# Patient Record
Sex: Female | Born: 1945
Health system: Southern US, Community
[De-identification: ages and names within clinical notes are randomized; demographics above are authoritative.]

## PROBLEM LIST (undated history)

## (undated) DIAGNOSIS — K219 Gastro-esophageal reflux disease without esophagitis: Secondary | ICD-10-CM

## (undated) HISTORY — DX: Gastro-esophageal reflux disease without esophagitis: K21.9

## (undated) HISTORY — PX: CATARACT EXTRACTION: SUR2

## (undated) HISTORY — PX: SHOULDER SURGERY: SHX246

## (undated) HISTORY — PX: TUBAL LIGATION: SHX77

---

## 1998-01-11 ENCOUNTER — Other Ambulatory Visit: Admission: RE | Admit: 1998-01-11 | Discharge: 1998-01-11 | Payer: Self-pay | Admitting: Obstetrics and Gynecology

## 1998-03-29 ENCOUNTER — Ambulatory Visit (HOSPITAL_BASED_OUTPATIENT_CLINIC_OR_DEPARTMENT_OTHER): Admission: RE | Admit: 1998-03-29 | Discharge: 1998-03-29 | Payer: Self-pay | Admitting: Orthopedic Surgery

## 1999-04-30 ENCOUNTER — Other Ambulatory Visit: Admission: RE | Admit: 1999-04-30 | Discharge: 1999-04-30 | Payer: Self-pay | Admitting: Obstetrics and Gynecology

## 2000-04-29 ENCOUNTER — Other Ambulatory Visit: Admission: RE | Admit: 2000-04-29 | Discharge: 2000-04-29 | Payer: Self-pay | Admitting: Obstetrics and Gynecology

## 2001-04-29 ENCOUNTER — Other Ambulatory Visit: Admission: RE | Admit: 2001-04-29 | Discharge: 2001-04-29 | Payer: Self-pay | Admitting: Obstetrics and Gynecology

## 2002-05-02 ENCOUNTER — Other Ambulatory Visit: Admission: RE | Admit: 2002-05-02 | Discharge: 2002-05-02 | Payer: Self-pay | Admitting: Obstetrics and Gynecology

## 2003-05-03 ENCOUNTER — Other Ambulatory Visit: Admission: RE | Admit: 2003-05-03 | Discharge: 2003-05-03 | Payer: Self-pay | Admitting: Gynecology

## 2004-05-06 ENCOUNTER — Other Ambulatory Visit: Admission: RE | Admit: 2004-05-06 | Discharge: 2004-05-06 | Payer: Self-pay | Admitting: Obstetrics and Gynecology

## 2005-05-20 ENCOUNTER — Other Ambulatory Visit: Admission: RE | Admit: 2005-05-20 | Discharge: 2005-05-20 | Payer: Self-pay | Admitting: Obstetrics and Gynecology

## 2006-05-22 ENCOUNTER — Other Ambulatory Visit: Admission: RE | Admit: 2006-05-22 | Discharge: 2006-05-22 | Payer: Self-pay | Admitting: Obstetrics and Gynecology

## 2006-12-02 ENCOUNTER — Other Ambulatory Visit: Admission: RE | Admit: 2006-12-02 | Discharge: 2006-12-02 | Payer: Self-pay | Admitting: Obstetrics and Gynecology

## 2007-05-24 ENCOUNTER — Other Ambulatory Visit: Admission: RE | Admit: 2007-05-24 | Discharge: 2007-05-24 | Payer: Self-pay | Admitting: Obstetrics and Gynecology

## 2008-05-24 ENCOUNTER — Other Ambulatory Visit: Admission: RE | Admit: 2008-05-24 | Discharge: 2008-05-24 | Payer: Self-pay | Admitting: Obstetrics and Gynecology

## 2008-05-24 ENCOUNTER — Encounter: Payer: Self-pay | Admitting: Obstetrics and Gynecology

## 2008-05-24 ENCOUNTER — Ambulatory Visit: Payer: Self-pay | Admitting: Obstetrics and Gynecology

## 2009-05-29 ENCOUNTER — Ambulatory Visit: Payer: Self-pay | Admitting: Obstetrics and Gynecology

## 2009-05-29 ENCOUNTER — Other Ambulatory Visit: Admission: RE | Admit: 2009-05-29 | Discharge: 2009-05-29 | Payer: Self-pay | Admitting: Obstetrics and Gynecology

## 2010-06-03 ENCOUNTER — Encounter: Payer: Self-pay | Admitting: Obstetrics and Gynecology

## 2011-06-09 DIAGNOSIS — Z79899 Other long term (current) drug therapy: Secondary | ICD-10-CM | POA: Diagnosis not present

## 2011-06-09 DIAGNOSIS — E559 Vitamin D deficiency, unspecified: Secondary | ICD-10-CM | POA: Diagnosis not present

## 2011-06-09 DIAGNOSIS — M899 Disorder of bone, unspecified: Secondary | ICD-10-CM | POA: Diagnosis not present

## 2011-06-09 DIAGNOSIS — J309 Allergic rhinitis, unspecified: Secondary | ICD-10-CM | POA: Diagnosis not present

## 2011-06-09 DIAGNOSIS — R232 Flushing: Secondary | ICD-10-CM | POA: Diagnosis not present

## 2011-06-09 DIAGNOSIS — D239 Other benign neoplasm of skin, unspecified: Secondary | ICD-10-CM | POA: Diagnosis not present

## 2011-06-09 DIAGNOSIS — Z124 Encounter for screening for malignant neoplasm of cervix: Secondary | ICD-10-CM | POA: Diagnosis not present

## 2011-06-09 DIAGNOSIS — Z0000 Encounter for general adult medical examination without abnormal findings: Secondary | ICD-10-CM | POA: Diagnosis not present

## 2011-06-09 DIAGNOSIS — Z1211 Encounter for screening for malignant neoplasm of colon: Secondary | ICD-10-CM | POA: Diagnosis not present

## 2011-06-09 DIAGNOSIS — M949 Disorder of cartilage, unspecified: Secondary | ICD-10-CM | POA: Diagnosis not present

## 2011-06-09 DIAGNOSIS — N951 Menopausal and female climacteric states: Secondary | ICD-10-CM | POA: Diagnosis not present

## 2011-07-03 DIAGNOSIS — L819 Disorder of pigmentation, unspecified: Secondary | ICD-10-CM | POA: Diagnosis not present

## 2011-07-03 DIAGNOSIS — D1801 Hemangioma of skin and subcutaneous tissue: Secondary | ICD-10-CM | POA: Diagnosis not present

## 2011-07-03 DIAGNOSIS — L821 Other seborrheic keratosis: Secondary | ICD-10-CM | POA: Diagnosis not present

## 2011-07-09 DIAGNOSIS — Z1231 Encounter for screening mammogram for malignant neoplasm of breast: Secondary | ICD-10-CM | POA: Diagnosis not present

## 2011-10-23 DIAGNOSIS — Z20828 Contact with and (suspected) exposure to other viral communicable diseases: Secondary | ICD-10-CM | POA: Diagnosis not present

## 2011-10-23 DIAGNOSIS — J309 Allergic rhinitis, unspecified: Secondary | ICD-10-CM | POA: Diagnosis not present

## 2011-10-23 DIAGNOSIS — R232 Flushing: Secondary | ICD-10-CM | POA: Diagnosis not present

## 2011-10-23 DIAGNOSIS — Z9229 Personal history of other drug therapy: Secondary | ICD-10-CM | POA: Diagnosis not present

## 2011-10-23 DIAGNOSIS — Z1159 Encounter for screening for other viral diseases: Secondary | ICD-10-CM | POA: Diagnosis not present

## 2011-10-23 DIAGNOSIS — Z23 Encounter for immunization: Secondary | ICD-10-CM | POA: Diagnosis not present

## 2011-10-28 DIAGNOSIS — H2510 Age-related nuclear cataract, unspecified eye: Secondary | ICD-10-CM | POA: Diagnosis not present

## 2011-12-19 DIAGNOSIS — R059 Cough, unspecified: Secondary | ICD-10-CM | POA: Diagnosis not present

## 2011-12-19 DIAGNOSIS — R232 Flushing: Secondary | ICD-10-CM | POA: Diagnosis not present

## 2011-12-19 DIAGNOSIS — J309 Allergic rhinitis, unspecified: Secondary | ICD-10-CM | POA: Diagnosis not present

## 2011-12-19 DIAGNOSIS — N951 Menopausal and female climacteric states: Secondary | ICD-10-CM | POA: Diagnosis not present

## 2011-12-19 DIAGNOSIS — G4700 Insomnia, unspecified: Secondary | ICD-10-CM | POA: Diagnosis not present

## 2011-12-24 DIAGNOSIS — R05 Cough: Secondary | ICD-10-CM | POA: Diagnosis not present

## 2012-06-09 DIAGNOSIS — Z1239 Encounter for other screening for malignant neoplasm of breast: Secondary | ICD-10-CM | POA: Diagnosis not present

## 2012-06-09 DIAGNOSIS — J309 Allergic rhinitis, unspecified: Secondary | ICD-10-CM | POA: Diagnosis not present

## 2012-06-09 DIAGNOSIS — F411 Generalized anxiety disorder: Secondary | ICD-10-CM | POA: Diagnosis not present

## 2012-06-09 DIAGNOSIS — M899 Disorder of bone, unspecified: Secondary | ICD-10-CM | POA: Diagnosis not present

## 2012-06-09 DIAGNOSIS — Z136 Encounter for screening for cardiovascular disorders: Secondary | ICD-10-CM | POA: Diagnosis not present

## 2012-07-12 DIAGNOSIS — Z13820 Encounter for screening for osteoporosis: Secondary | ICD-10-CM | POA: Diagnosis not present

## 2012-07-12 DIAGNOSIS — Z780 Asymptomatic menopausal state: Secondary | ICD-10-CM | POA: Diagnosis not present

## 2012-07-12 DIAGNOSIS — Z1231 Encounter for screening mammogram for malignant neoplasm of breast: Secondary | ICD-10-CM | POA: Diagnosis not present

## 2012-10-29 DIAGNOSIS — H524 Presbyopia: Secondary | ICD-10-CM | POA: Diagnosis not present

## 2012-10-29 DIAGNOSIS — H43819 Vitreous degeneration, unspecified eye: Secondary | ICD-10-CM | POA: Diagnosis not present

## 2012-10-29 DIAGNOSIS — H2510 Age-related nuclear cataract, unspecified eye: Secondary | ICD-10-CM | POA: Diagnosis not present

## 2012-10-29 DIAGNOSIS — H5200 Hypermetropia, unspecified eye: Secondary | ICD-10-CM | POA: Diagnosis not present

## 2012-11-11 DIAGNOSIS — Z23 Encounter for immunization: Secondary | ICD-10-CM | POA: Diagnosis not present

## 2013-01-10 DIAGNOSIS — J069 Acute upper respiratory infection, unspecified: Secondary | ICD-10-CM | POA: Diagnosis not present

## 2013-06-13 DIAGNOSIS — L851 Acquired keratosis [keratoderma] palmaris et plantaris: Secondary | ICD-10-CM | POA: Diagnosis not present

## 2013-06-13 DIAGNOSIS — F330 Major depressive disorder, recurrent, mild: Secondary | ICD-10-CM | POA: Diagnosis not present

## 2013-06-13 DIAGNOSIS — Z136 Encounter for screening for cardiovascular disorders: Secondary | ICD-10-CM | POA: Diagnosis not present

## 2013-06-13 DIAGNOSIS — Z0000 Encounter for general adult medical examination without abnormal findings: Secondary | ICD-10-CM | POA: Diagnosis not present

## 2013-06-13 DIAGNOSIS — B009 Herpesviral infection, unspecified: Secondary | ICD-10-CM | POA: Diagnosis not present

## 2013-06-13 DIAGNOSIS — Z1211 Encounter for screening for malignant neoplasm of colon: Secondary | ICD-10-CM | POA: Diagnosis not present

## 2013-06-13 DIAGNOSIS — Z01419 Encounter for gynecological examination (general) (routine) without abnormal findings: Secondary | ICD-10-CM | POA: Diagnosis not present

## 2013-06-13 DIAGNOSIS — J3089 Other allergic rhinitis: Secondary | ICD-10-CM | POA: Diagnosis not present

## 2013-07-01 DIAGNOSIS — L255 Unspecified contact dermatitis due to plants, except food: Secondary | ICD-10-CM | POA: Diagnosis not present

## 2013-07-01 DIAGNOSIS — L851 Acquired keratosis [keratoderma] palmaris et plantaris: Secondary | ICD-10-CM | POA: Diagnosis not present

## 2013-07-01 DIAGNOSIS — J3089 Other allergic rhinitis: Secondary | ICD-10-CM | POA: Diagnosis not present

## 2013-07-13 DIAGNOSIS — Z1231 Encounter for screening mammogram for malignant neoplasm of breast: Secondary | ICD-10-CM | POA: Diagnosis not present

## 2013-10-21 DIAGNOSIS — Z23 Encounter for immunization: Secondary | ICD-10-CM | POA: Diagnosis not present

## 2013-10-21 DIAGNOSIS — J309 Allergic rhinitis, unspecified: Secondary | ICD-10-CM | POA: Diagnosis not present

## 2013-10-21 DIAGNOSIS — F411 Generalized anxiety disorder: Secondary | ICD-10-CM | POA: Diagnosis not present

## 2013-10-21 DIAGNOSIS — N390 Urinary tract infection, site not specified: Secondary | ICD-10-CM | POA: Diagnosis not present

## 2013-10-21 DIAGNOSIS — N951 Menopausal and female climacteric states: Secondary | ICD-10-CM | POA: Diagnosis not present

## 2013-10-31 DIAGNOSIS — S82009A Unspecified fracture of unspecified patella, initial encounter for closed fracture: Secondary | ICD-10-CM | POA: Diagnosis not present

## 2013-11-02 DIAGNOSIS — H2510 Age-related nuclear cataract, unspecified eye: Secondary | ICD-10-CM | POA: Diagnosis not present

## 2013-11-02 DIAGNOSIS — H5200 Hypermetropia, unspecified eye: Secondary | ICD-10-CM | POA: Diagnosis not present

## 2013-11-21 DIAGNOSIS — M25562 Pain in left knee: Secondary | ICD-10-CM | POA: Diagnosis not present

## 2013-11-21 DIAGNOSIS — S82035D Nondisplaced transverse fracture of left patella, subsequent encounter for closed fracture with routine healing: Secondary | ICD-10-CM | POA: Diagnosis not present

## 2013-12-15 DIAGNOSIS — R05 Cough: Secondary | ICD-10-CM | POA: Diagnosis not present

## 2013-12-15 DIAGNOSIS — J309 Allergic rhinitis, unspecified: Secondary | ICD-10-CM | POA: Diagnosis not present

## 2013-12-15 DIAGNOSIS — Z23 Encounter for immunization: Secondary | ICD-10-CM | POA: Diagnosis not present

## 2013-12-15 DIAGNOSIS — F411 Generalized anxiety disorder: Secondary | ICD-10-CM | POA: Diagnosis not present

## 2013-12-15 DIAGNOSIS — B009 Herpesviral infection, unspecified: Secondary | ICD-10-CM | POA: Diagnosis not present

## 2013-12-16 ENCOUNTER — Ambulatory Visit
Admission: RE | Admit: 2013-12-16 | Discharge: 2013-12-16 | Disposition: A | Discharge status: Home / Self Care | Payer: Medicare Other | Source: Ambulatory Visit | Attending: Family Medicine | Admitting: Family Medicine

## 2013-12-16 ENCOUNTER — Other Ambulatory Visit: Payer: Self-pay | Admitting: Family Medicine

## 2013-12-16 DIAGNOSIS — R05 Cough: Secondary | ICD-10-CM

## 2013-12-16 DIAGNOSIS — R053 Chronic cough: Secondary | ICD-10-CM

## 2013-12-16 DIAGNOSIS — R202 Paresthesia of skin: Secondary | ICD-10-CM | POA: Diagnosis not present

## 2014-01-02 DIAGNOSIS — R05 Cough: Secondary | ICD-10-CM | POA: Diagnosis not present

## 2014-01-02 DIAGNOSIS — B009 Herpesviral infection, unspecified: Secondary | ICD-10-CM | POA: Diagnosis not present

## 2014-01-02 DIAGNOSIS — J309 Allergic rhinitis, unspecified: Secondary | ICD-10-CM | POA: Diagnosis not present

## 2014-09-14 ENCOUNTER — Encounter (INDEPENDENT_AMBULATORY_CARE_PROVIDER_SITE_OTHER): Payer: Self-pay

## 2014-09-14 ENCOUNTER — Encounter: Payer: Self-pay | Admitting: Internal Medicine

## 2014-09-14 ENCOUNTER — Ambulatory Visit (INDEPENDENT_AMBULATORY_CARE_PROVIDER_SITE_OTHER): Payer: PPO | Admitting: Internal Medicine

## 2014-09-14 VITALS — BP 130/88 | HR 90 | Ht 66.0 in | Wt 168.4 lb

## 2014-09-14 DIAGNOSIS — R05 Cough: Secondary | ICD-10-CM | POA: Insufficient documentation

## 2014-09-14 DIAGNOSIS — R058 Other specified cough: Secondary | ICD-10-CM | POA: Insufficient documentation

## 2014-09-14 MED ORDER — GABAPENTIN 100 MG PO CAPS: 100.0000 mg | ORAL_CAPSULE | Freq: Three times a day (TID) | ORAL | Status: DC

## 2014-09-14 NOTE — Progress Notes (Signed)
Subjective:    Patient ID: Karen Mcclain, female    DOB: 1945-08-15,   MRN: [id number]  HPI  35  yowf never smoker never allergy/ asthma with new onset persistent daily cough x 2010 with minimal  response to treatment for allergies/ GERD so referred to pulmonary clinic by DR Minna Merritts.  09/14/2014 1st [facility] office visit/    Chief Complaint  Patient presents with  . Advice Only    Referred by Dr. Ernesto Rutherford; cough for many years, Dr. Ernie Hew has been treating her for allergies; allergy testing showed no allergies except to cats.  Dr. Ernesto Rutherford treated her for acid reflux, but not sure if this is the problem.  Pt had bronchitis in high school that caused her to go into hospital.  present daily with indolent onset min variability  and not as severe as at onset and no evidence of gerd while off ppi x 2 weeks as of 09/11/14 based on neg ent eval and whereas previous she had seen purulent drainage she did not have on last eval but was still coughing with sensation of continue pnds.  Has tried singulair/ advair/ prednisone / abx and reflux meds     Kouffman Reflux v Neurogenic Cough Differentiator Reflux Comments  Do you awaken from a sound sleep coughing violently?                            With trouble breathing? Yes at least once weekly    Do you have choking episodes when you cannot  Get enough air, gasping for air ?              Yes   Do you usually cough when you lie down into  The bed, or when you just lie down to rest ?                          no   Do you usually cough after meals or eating?         Yes   Do you cough when (or after) you bend over?    Not aware   GERD SCORE     Kouffman Reflux v Neurogenic Cough Differentiator Neurogenic   Do you more-or-less cough all day long? yes   Does change of temperature make you cough? No    Does laughing or chuckling cause you to cough? yes   Do fumes (perfume, automobile fumes, burned  Toast, etc.,) cause you to cough  ?      No    Does speaking, singing, or talking on the phone cause you to cough   ?               Some    Neurogenic/Airway score       No obvious other patterns in day to day or daytime variabilty or assoc chronic cough or cp or chest tightness, subjective wheeze overt sinus or hb symptoms. No unusual exp hx or h/o childhood pna/ asthma or knowledge of premature birth.  Sleeping ok without nocturnal  or early am exacerbation  of respiratory  c/o's or need for noct saba. Also denies any obvious fluctuation of symptoms with weather or environmental changes or other aggravating or alleviating factors except as outlined above   Current Medications, Allergies, Complete Past Medical History, Past Surgical History, Family History, and Social History were reviewed in [employer] record.  Review of Systems  Constitutional: Negative for fever, chills and unexpected weight change.  HENT: Positive for postnasal drip. Negative for congestion, dental problem, ear pain, nosebleeds, rhinorrhea, sinus pressure, sneezing, sore throat, trouble swallowing and voice change.   Eyes: Negative for visual disturbance.  Respiratory: Positive for cough and choking. Negative for shortness of breath.   Cardiovascular: Negative for chest pain and leg swelling.  Gastrointestinal: Negative for vomiting, abdominal pain and diarrhea.  Genitourinary: Negative for difficulty urinating.  Musculoskeletal: Negative for arthralgias.  Skin: Negative for rash.  Neurological: Negative for tremors, syncope and headaches.  Hematological: Does not bruise/bleed easily.       Objective:   Physical Exam   amb pleasant wf nad  Wt Readings from Last 3 Encounters:  09/14/14 168 lb 6.4 oz (76.386 kg)    Vital signs reviewed   HEENT: nl dentition, turbinates, and orophanx. Nl external ear canals without cough reflex   NECK :  without JVD/Nodes/TM/ nl carotid upstrokes  bilaterally   LUNGS: no acc muscle use, clear to A and P bilaterally without cough on insp or exp maneuvers   CV:  RRR  no s3 or murmur or increase in P2, no edema   ABD:  soft and nontender with nl excursion in the supine position. No bruits or organomegaly, bowel sounds nl  MS:  warm without deformities, calf tenderness, cyanosis or clubbing  SKIN: warm and dry without lesions    NEURO:  alert, approp, no deficits       I personally reviewed images and agree with radiology impression as follows:  CXR:  12/16/13 There is no active cardiopulmonary disease.     Assessment & Plan:

## 2014-09-14 NOTE — Assessment & Plan Note (Signed)
The most common causes of chronic cough in immunocompetent adults include the following: upper airway cough syndrome (UACS), previously referred to as postnasal drip syndrome (PNDS), which is caused by variety of rhinosinus conditions; (2) asthma; (3) GERD; (4) chronic bronchitis from cigarette smoking or other inhaled environmental irritants; (5) nonasthmatic eosinophilic bronchitis; and (6) bronchiectasis.   These conditions, singly or in combination, have accounted for up to 94% of the causes of chronic cough in prospective studies.   Other conditions have constituted no >6% of the causes in prospective studies These have included bronchogenic carcinoma, chronic interstitial pneumonia, sarcoidosis, left ventricular failure, ACEI-induced cough, and aspiration from a condition associated with pharyngeal dysfunction.    Chronic cough is often simultaneously caused by more than one condition. A single cause has been found from 38 to 82% of the time, multiple causes from 18 to 62%. Multiply caused cough has been the result of three diseases up to 42% of the time.       Based on hx and exam, this is most likely:  Classic Upper airway cough syndrome, so named because it's frequently impossible to sort out how much is  CR/sinusitis with freq throat clearing (which can be related to primary GERD)   vs  causing  secondary (" extra esophageal")  GERD from wide swings in gastric pressure that occur with throat clearing, often  promoting self use of mint and menthol lozenges that reduce the lower esophageal sphincter tone and exacerbate the problem further in a cyclical fashion.   These are the same pts (now being labeled as having "irritable larynx syndrome" by some cough centers) who not infrequently have a history of having failed to tolerate ace inhibitors,  dry powder inhalers or biphosphonates or report having atypical reflux symptoms that don't respond to standard doses of PPI , and are easily confused as  having aecopd or asthma flares by even experienced allergists/ pulmonologists.   The first step is to maximize acid suppression and eliminate cyclical coughing then trial of neurontin  I had an extended discussion with the patient reviewing all relevant studies completed to date and  lasting 35 minutes    First I explained that the standardized cough guidelines published in Chest by Lissa Morales in 2006 are still the best available and consist of a multiple step process (up to 12!) , not a single office visit,  and are intended  to address this problem logically,  with an alogrithm dependent on response to empiric treatment at  each progressive step  to determine a specific diagnosis with  minimal addtional testing needed. Therefore if adherence is an issue or can't be accurately verified,  it's very unlikely the standard evaluation and treatment will be successful here.    Furthermore, response to therapy (other than acute cough suppression, which should only be used short term with avoidance of narcotic containing cough syrups if possible), can be a gradual process for which the patient may perceive immediate benefit.  Unlike going to an eye doctor where the best perscription is almost always the first one and is immediately effective, this is almost never the case in the management of chronic cough syndromes. Therefore the patient needs to commit up front to consistently adhere to recommendations  for up to 6 weeks of therapy directed at the likely underlying problem(s) before the response can be reasonably evaluated.   Each maintenance medication was reviewed in detail including most importantly the difference between maintenance and prns and under what circumstances  the prns are to be triggered using an action plan format that is not reflected in the computer generated alphabetically organized AVS.    Please see instructions for details which were reviewed in writing and the patient given a copy  highlighting the part that I personally wrote and discussed at today's ov.    Marland Kitchen

## 2014-09-14 NOTE — Patient Instructions (Addendum)
Dx Upper airway cough syndrome = Dr Lissa Morales  Pepcid ac 20 mg and For drainage take chlortrimeton (chlorpheniramine) 4 mg every 4 hours available over the counter (may cause drowsiness)   GERD (REFLUX)  is an extremely common cause of respiratory symptoms just like yours , many times with no obvious heartburn at all.    It can be treated with medication, but also with lifestyle changes including elevation of the head of your bed (ideally with 6 inch  bed blocks),  Smoking cessation, avoidance of late meals, excessive alcohol, and avoid fatty foods, chocolate, peppermint, colas, red wine, and acidic juices such as orange juice.  NO MINT OR MENTHOL PRODUCTS SO NO COUGH DROPS  USE SUGARLESS CANDY INSTEAD (Jolley ranchers or Stover's or Life Savers) or even ice chips will also do - the key is to swallow to prevent all throat clearing. NO OIL BASED VITAMINS - use powdered substitutes   If  not improving after two weeks >>  add gabapentin 100 mg three times daily and return after two weeks of therapy  If better, tell your friends

## 2014-10-03 ENCOUNTER — Telehealth: Payer: Self-pay | Admitting: Internal Medicine

## 2014-10-03 NOTE — Telephone Encounter (Signed)
error 

## 2015-03-07 DIAGNOSIS — I10 Essential (primary) hypertension: Secondary | ICD-10-CM

## 2015-03-07 HISTORY — DX: Essential (primary) hypertension: I10

## 2015-03-25 IMAGING — CR DG CHEST 2V
2 series · 2 of 2 positions shown · non-contrast
Comparison: None.

CLINICAL DATA: Chronic cough and tingling sensation in the throat 4
year without fever questionable shortness of breath

EXAM:
CHEST  2 VIEW

[view not recorded (1 of 2)]
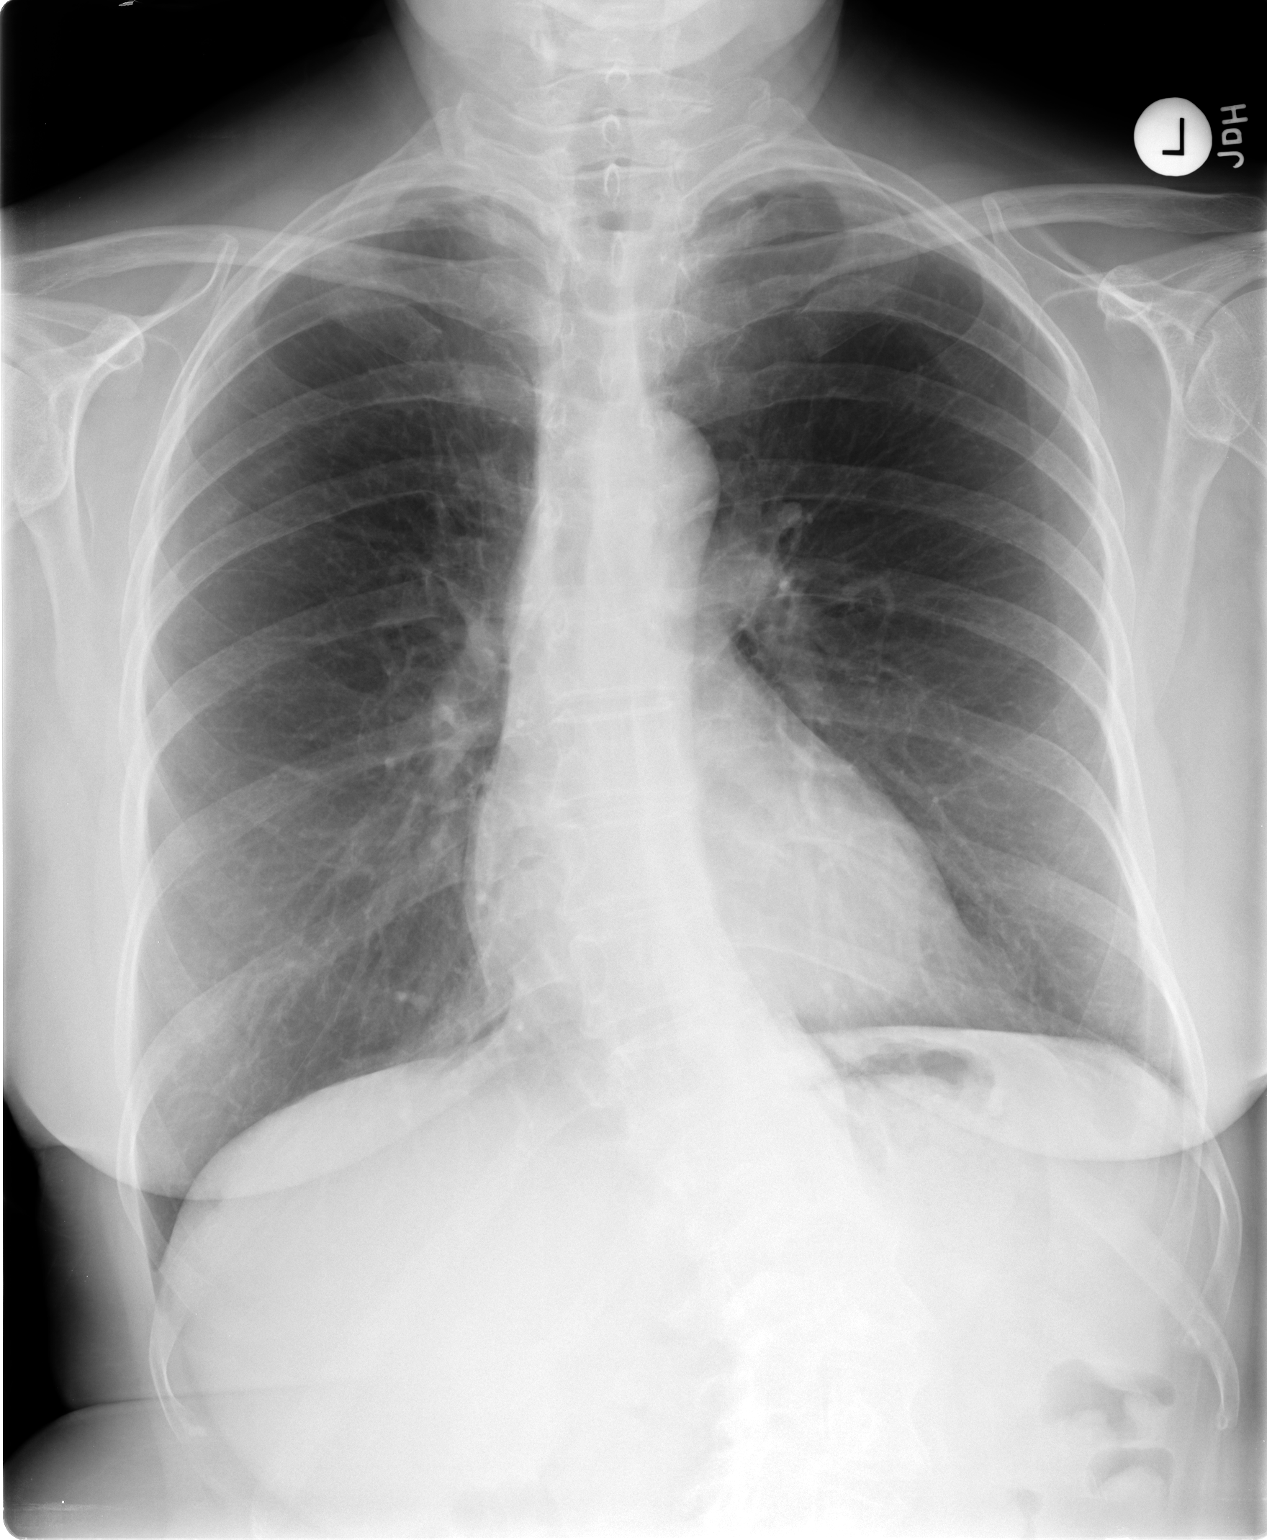

[view not recorded (2 of 2)]
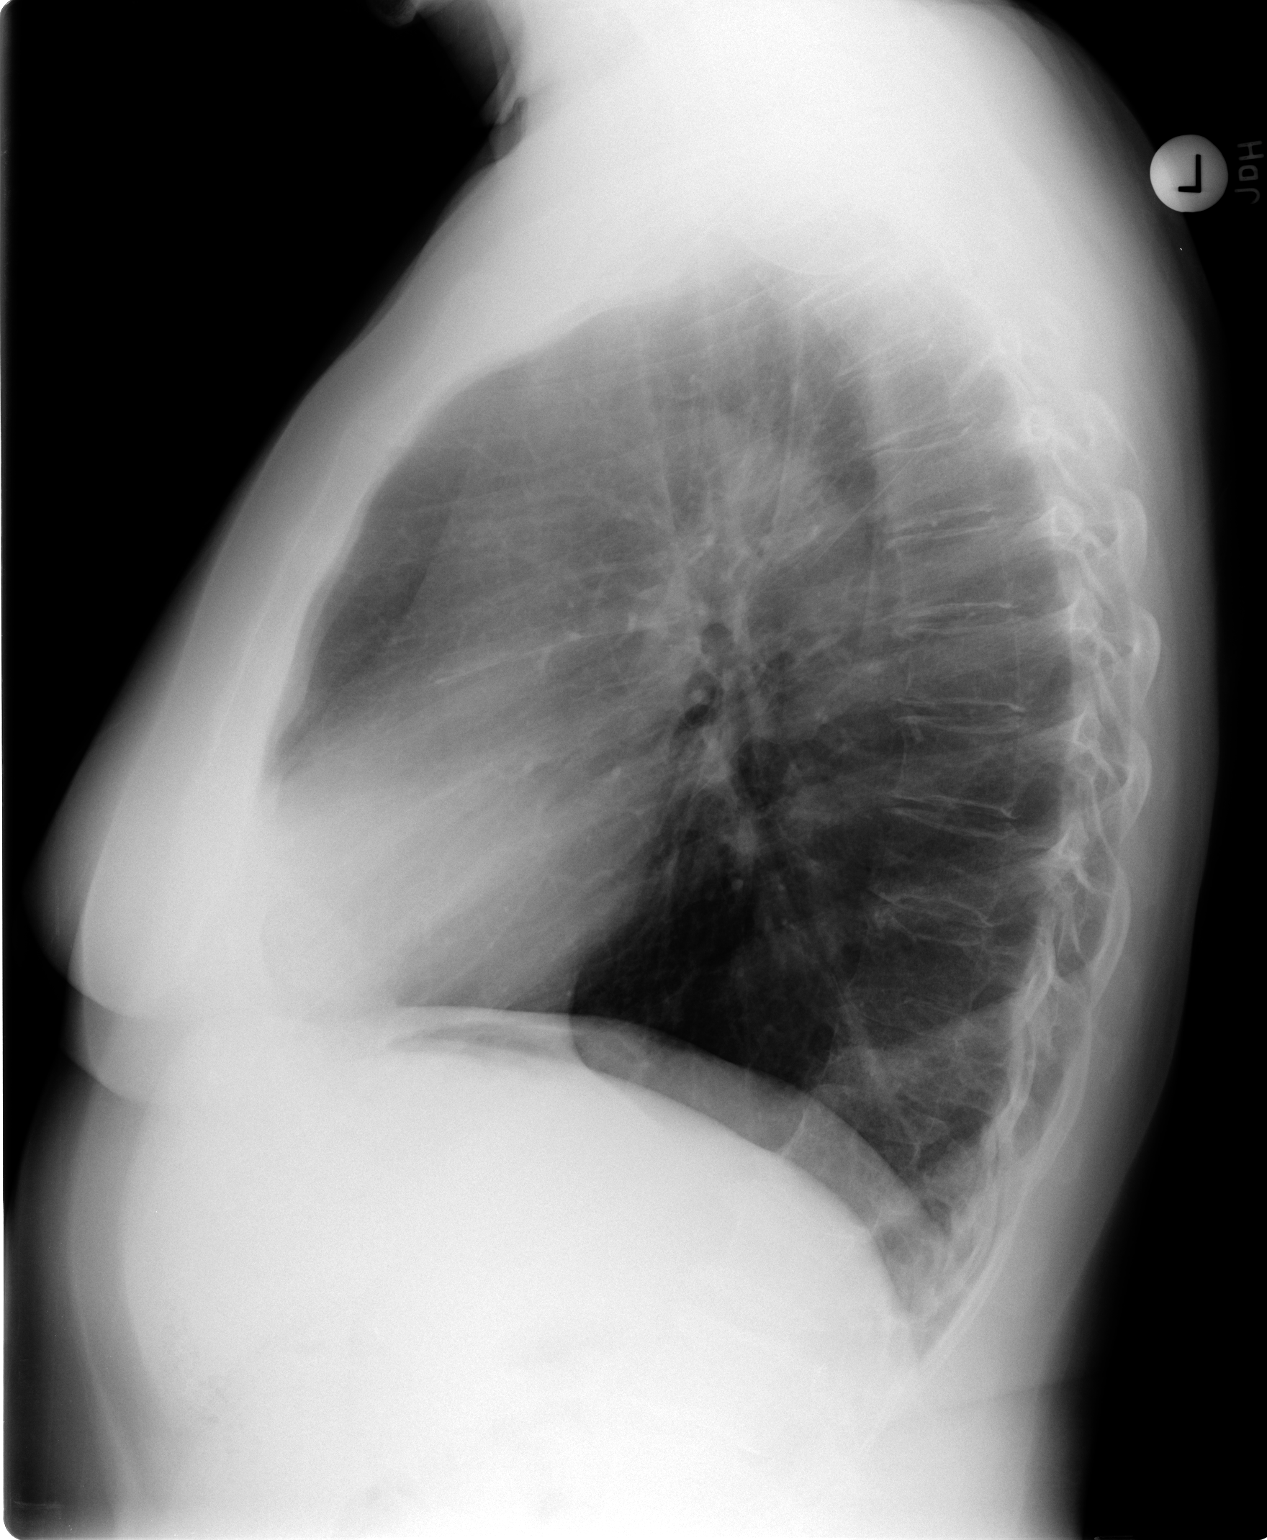

[2 of 2 positions shown; findings below may reference images not displayed]

FINDINGS: The lungs are well-expanded and clear. The heart and pulmonary
vascularity are normal. There is no pleural effusion or
pneumothorax. The mediastinum is normal in width. The bony thorax is
unremarkable with the exception of S-shaped thoracolumbar scoliosis.
IMPRESSION: There is no active cardiopulmonary disease.

## 2015-04-04 DIAGNOSIS — N951 Menopausal and female climacteric states: Secondary | ICD-10-CM | POA: Diagnosis not present

## 2015-04-04 DIAGNOSIS — J3089 Other allergic rhinitis: Secondary | ICD-10-CM | POA: Diagnosis not present

## 2015-04-04 DIAGNOSIS — M810 Age-related osteoporosis without current pathological fracture: Secondary | ICD-10-CM | POA: Diagnosis not present

## 2015-04-04 DIAGNOSIS — F411 Generalized anxiety disorder: Secondary | ICD-10-CM | POA: Diagnosis not present

## 2015-04-04 DIAGNOSIS — F330 Major depressive disorder, recurrent, mild: Secondary | ICD-10-CM | POA: Diagnosis not present

## 2015-04-04 DIAGNOSIS — Z136 Encounter for screening for cardiovascular disorders: Secondary | ICD-10-CM | POA: Diagnosis not present

## 2015-04-06 DIAGNOSIS — I10 Essential (primary) hypertension: Secondary | ICD-10-CM | POA: Diagnosis not present

## 2015-04-06 DIAGNOSIS — J3089 Other allergic rhinitis: Secondary | ICD-10-CM | POA: Diagnosis not present

## 2015-04-06 DIAGNOSIS — R05 Cough: Secondary | ICD-10-CM | POA: Diagnosis not present

## 2015-04-06 DIAGNOSIS — F411 Generalized anxiety disorder: Secondary | ICD-10-CM | POA: Diagnosis not present

## 2015-04-11 DIAGNOSIS — D2372 Other benign neoplasm of skin of left lower limb, including hip: Secondary | ICD-10-CM | POA: Diagnosis not present

## 2015-04-11 DIAGNOSIS — L918 Other hypertrophic disorders of the skin: Secondary | ICD-10-CM | POA: Diagnosis not present

## 2015-04-11 DIAGNOSIS — L738 Other specified follicular disorders: Secondary | ICD-10-CM | POA: Diagnosis not present

## 2015-04-11 DIAGNOSIS — L814 Other melanin hyperpigmentation: Secondary | ICD-10-CM | POA: Diagnosis not present

## 2015-04-11 DIAGNOSIS — D1801 Hemangioma of skin and subcutaneous tissue: Secondary | ICD-10-CM | POA: Diagnosis not present

## 2015-04-11 DIAGNOSIS — L813 Cafe au lait spots: Secondary | ICD-10-CM | POA: Diagnosis not present

## 2015-04-11 DIAGNOSIS — L821 Other seborrheic keratosis: Secondary | ICD-10-CM | POA: Diagnosis not present

## 2015-05-09 DIAGNOSIS — I10 Essential (primary) hypertension: Secondary | ICD-10-CM | POA: Diagnosis not present

## 2015-05-09 DIAGNOSIS — F411 Generalized anxiety disorder: Secondary | ICD-10-CM | POA: Diagnosis not present

## 2015-06-21 DIAGNOSIS — F330 Major depressive disorder, recurrent, mild: Secondary | ICD-10-CM | POA: Diagnosis not present

## 2015-06-21 DIAGNOSIS — K219 Gastro-esophageal reflux disease without esophagitis: Secondary | ICD-10-CM | POA: Diagnosis not present

## 2015-06-21 DIAGNOSIS — I10 Essential (primary) hypertension: Secondary | ICD-10-CM | POA: Diagnosis not present

## 2015-06-21 DIAGNOSIS — F411 Generalized anxiety disorder: Secondary | ICD-10-CM | POA: Diagnosis not present

## 2015-07-19 DIAGNOSIS — Z1231 Encounter for screening mammogram for malignant neoplasm of breast: Secondary | ICD-10-CM | POA: Diagnosis not present

## 2015-11-07 DIAGNOSIS — H2513 Age-related nuclear cataract, bilateral: Secondary | ICD-10-CM | POA: Diagnosis not present

## 2015-11-07 DIAGNOSIS — H5702 Anisocoria: Secondary | ICD-10-CM | POA: Diagnosis not present

## 2015-11-07 DIAGNOSIS — H524 Presbyopia: Secondary | ICD-10-CM | POA: Diagnosis not present

## 2015-11-20 DIAGNOSIS — I10 Essential (primary) hypertension: Secondary | ICD-10-CM | POA: Diagnosis not present

## 2015-11-20 DIAGNOSIS — Z6829 Body mass index (BMI) 29.0-29.9, adult: Secondary | ICD-10-CM | POA: Diagnosis not present

## 2015-11-20 DIAGNOSIS — Z1211 Encounter for screening for malignant neoplasm of colon: Secondary | ICD-10-CM | POA: Diagnosis not present

## 2015-11-20 DIAGNOSIS — J069 Acute upper respiratory infection, unspecified: Secondary | ICD-10-CM | POA: Diagnosis not present

## 2015-11-20 DIAGNOSIS — F411 Generalized anxiety disorder: Secondary | ICD-10-CM | POA: Diagnosis not present

## 2015-11-20 DIAGNOSIS — Z0000 Encounter for general adult medical examination without abnormal findings: Secondary | ICD-10-CM | POA: Diagnosis not present

## 2015-12-04 DIAGNOSIS — Z23 Encounter for immunization: Secondary | ICD-10-CM | POA: Diagnosis not present

## 2015-12-13 DIAGNOSIS — Z1211 Encounter for screening for malignant neoplasm of colon: Secondary | ICD-10-CM | POA: Diagnosis not present

## 2015-12-18 ENCOUNTER — Encounter: Payer: Self-pay | Admitting: Family Medicine

## 2016-01-24 ENCOUNTER — Ambulatory Visit (INDEPENDENT_AMBULATORY_CARE_PROVIDER_SITE_OTHER): Payer: PPO | Admitting: Orthopedic Surgery

## 2016-01-24 ENCOUNTER — Ambulatory Visit (INDEPENDENT_AMBULATORY_CARE_PROVIDER_SITE_OTHER): Payer: PPO

## 2016-01-24 ENCOUNTER — Encounter (INDEPENDENT_AMBULATORY_CARE_PROVIDER_SITE_OTHER): Payer: Self-pay | Admitting: Orthopedic Surgery

## 2016-01-24 DIAGNOSIS — M25562 Pain in left knee: Secondary | ICD-10-CM | POA: Diagnosis not present

## 2016-01-24 DIAGNOSIS — G8929 Other chronic pain: Secondary | ICD-10-CM | POA: Diagnosis not present

## 2016-01-24 DIAGNOSIS — M25561 Pain in right knee: Secondary | ICD-10-CM | POA: Diagnosis not present

## 2016-01-24 NOTE — Progress Notes (Signed)
   Office Visit Note   Patient: Karen Mcclain           Date of Birth: [birth year]           MRN: [id number] Visit Date: 01/24/2016              Requested by: Fanny Bien, MD [address] [place], [place] [address] PCP: Rachell Cipro, MD   Assessment & Plan: Visit Diagnoses:  1. Chronic pain of left knee   2. Chronic pain of right knee     Plan: Patient has no mechanical instability of her knees. She states she's had one episode of giving way with the right knee. Recommend she pursue the silver sneaker for strengthening of both knees recommended Aleve 1 by mouth twice a day follow-up as needed.  Follow-Up Instructions: Return if symptoms worsen or fail to improve.   Orders:  Orders Placed This Encounter  Procedures  . XR KNEE 3 VIEW LEFT  . XR KNEE 3 VIEW RIGHT   No orders of the defined types were placed in this encounter.     Procedures: No procedures performed   Clinical Data: No additional findings.   Subjective: Chief Complaint  Patient presents with  . Right Knee - Pain  . Left Knee - Pain    Ms. Piercefield is here for bilateral knee pain. She states that she fell about one month ago and she is having stabbing like pains in the right knee.  She is also having pain in her proximal tib/fib at night and she is not sure if she needs to exercise them or rest them.    Review of Systems   Objective: Vital Signs: There were no vitals taken for this visit.  Physical Exam examination patient is alert oriented no adenopathy well-dressed normal affect normal respiratory effort she has a normal gait. Examination of both knees her knees are straight no increased varus or valgus malalignment. She has no effusion no redness no cellulitis. Patient is very minimal tenderness to palpation medial lateral joint lines bilaterally collaterals and cruciates are stable bilaterally. There is mild crepitation with range of motion.  Ortho Exam  Specialty  Comments:  No specialty comments available.  Imaging: Xr Knee 3 View Left  Result Date: 01/24/2016 Three-view radiographs of the left knee shows mild joint space narrowing medially was some mild subcondylar sclerosis no periarticular bony spurs was some mild lateral tracking of the patella  Xr Knee 3 View Right  Result Date: 01/24/2016 Three-view radiographs of the right knee show some medial joint line narrowing. Her joint is congruent and the sunrise view mild subcondylar sclerosis no periarticular bony spurs.    PMFS History: Patient Active Problem List   Diagnosis Date Noted  . Chronic pain of right knee 01/24/2016  . Upper airway cough syndrome 09/14/2014   No past medical history on file.  Family History  Problem Relation Age of Onset  . Heart disease Father   . Heart disease Mother     Past Surgical History:  Procedure Laterality Date  . TUBAL LIGATION     Social History   Occupational History  . Not on file.   Social History Main Topics  . Smoking status: Never Smoker  . Smokeless tobacco: Not on file  . Alcohol use 0.0 oz/week     Comment: on weekends - 2 glasses wine.  . Drug use: No  . Sexual activity: Not on file

## 2016-04-10 DIAGNOSIS — L821 Other seborrheic keratosis: Secondary | ICD-10-CM | POA: Diagnosis not present

## 2016-04-10 DIAGNOSIS — L814 Other melanin hyperpigmentation: Secondary | ICD-10-CM | POA: Diagnosis not present

## 2016-04-10 DIAGNOSIS — L918 Other hypertrophic disorders of the skin: Secondary | ICD-10-CM | POA: Diagnosis not present

## 2016-04-10 DIAGNOSIS — L820 Inflamed seborrheic keratosis: Secondary | ICD-10-CM | POA: Diagnosis not present

## 2016-04-10 DIAGNOSIS — D1801 Hemangioma of skin and subcutaneous tissue: Secondary | ICD-10-CM | POA: Diagnosis not present

## 2016-04-10 DIAGNOSIS — L813 Cafe au lait spots: Secondary | ICD-10-CM | POA: Diagnosis not present

## 2016-06-18 DIAGNOSIS — I10 Essential (primary) hypertension: Secondary | ICD-10-CM | POA: Diagnosis not present

## 2016-06-18 DIAGNOSIS — Z136 Encounter for screening for cardiovascular disorders: Secondary | ICD-10-CM | POA: Diagnosis not present

## 2016-06-20 DIAGNOSIS — F411 Generalized anxiety disorder: Secondary | ICD-10-CM | POA: Diagnosis not present

## 2016-06-20 DIAGNOSIS — Z6831 Body mass index (BMI) 31.0-31.9, adult: Secondary | ICD-10-CM | POA: Diagnosis not present

## 2016-06-20 DIAGNOSIS — K219 Gastro-esophageal reflux disease without esophagitis: Secondary | ICD-10-CM | POA: Diagnosis not present

## 2016-06-20 DIAGNOSIS — I10 Essential (primary) hypertension: Secondary | ICD-10-CM | POA: Diagnosis not present

## 2016-07-29 DIAGNOSIS — M8589 Other specified disorders of bone density and structure, multiple sites: Secondary | ICD-10-CM | POA: Diagnosis not present

## 2016-07-29 DIAGNOSIS — Z1231 Encounter for screening mammogram for malignant neoplasm of breast: Secondary | ICD-10-CM | POA: Diagnosis not present

## 2016-07-29 DIAGNOSIS — M810 Age-related osteoporosis without current pathological fracture: Secondary | ICD-10-CM | POA: Diagnosis not present

## 2016-09-04 DIAGNOSIS — E559 Vitamin D deficiency, unspecified: Secondary | ICD-10-CM | POA: Diagnosis not present

## 2016-09-04 DIAGNOSIS — K219 Gastro-esophageal reflux disease without esophagitis: Secondary | ICD-10-CM | POA: Diagnosis not present

## 2016-09-04 DIAGNOSIS — M8580 Other specified disorders of bone density and structure, unspecified site: Secondary | ICD-10-CM | POA: Diagnosis not present

## 2016-09-10 DIAGNOSIS — K219 Gastro-esophageal reflux disease without esophagitis: Secondary | ICD-10-CM | POA: Diagnosis not present

## 2016-09-10 DIAGNOSIS — J449 Chronic obstructive pulmonary disease, unspecified: Secondary | ICD-10-CM | POA: Diagnosis not present

## 2016-09-10 DIAGNOSIS — R05 Cough: Secondary | ICD-10-CM | POA: Diagnosis not present

## 2016-10-09 DIAGNOSIS — Z23 Encounter for immunization: Secondary | ICD-10-CM | POA: Diagnosis not present

## 2016-10-09 DIAGNOSIS — J449 Chronic obstructive pulmonary disease, unspecified: Secondary | ICD-10-CM | POA: Diagnosis not present

## 2016-10-09 DIAGNOSIS — R05 Cough: Secondary | ICD-10-CM | POA: Diagnosis not present

## 2016-10-09 DIAGNOSIS — J3089 Other allergic rhinitis: Secondary | ICD-10-CM | POA: Diagnosis not present

## 2016-10-09 DIAGNOSIS — K219 Gastro-esophageal reflux disease without esophagitis: Secondary | ICD-10-CM | POA: Diagnosis not present

## 2016-11-20 DIAGNOSIS — H5203 Hypermetropia, bilateral: Secondary | ICD-10-CM | POA: Diagnosis not present

## 2016-11-20 DIAGNOSIS — H2513 Age-related nuclear cataract, bilateral: Secondary | ICD-10-CM | POA: Diagnosis not present

## 2016-11-28 DIAGNOSIS — Z6830 Body mass index (BMI) 30.0-30.9, adult: Secondary | ICD-10-CM | POA: Diagnosis not present

## 2016-11-28 DIAGNOSIS — Z0000 Encounter for general adult medical examination without abnormal findings: Secondary | ICD-10-CM | POA: Diagnosis not present

## 2016-11-28 DIAGNOSIS — F411 Generalized anxiety disorder: Secondary | ICD-10-CM | POA: Diagnosis not present

## 2016-11-28 DIAGNOSIS — K219 Gastro-esophageal reflux disease without esophagitis: Secondary | ICD-10-CM | POA: Diagnosis not present

## 2016-11-28 DIAGNOSIS — Z23 Encounter for immunization: Secondary | ICD-10-CM | POA: Diagnosis not present

## 2016-11-28 DIAGNOSIS — M810 Age-related osteoporosis without current pathological fracture: Secondary | ICD-10-CM | POA: Diagnosis not present

## 2016-11-28 DIAGNOSIS — J3089 Other allergic rhinitis: Secondary | ICD-10-CM | POA: Diagnosis not present

## 2016-12-17 ENCOUNTER — Ambulatory Visit
Admission: RE | Admit: 2016-12-17 | Discharge: 2016-12-17 | Disposition: A | Discharge status: Home / Self Care | Payer: PPO | Source: Ambulatory Visit | Attending: Family Medicine | Admitting: Family Medicine

## 2016-12-17 ENCOUNTER — Other Ambulatory Visit: Payer: Self-pay | Admitting: Family Medicine

## 2016-12-17 DIAGNOSIS — R05 Cough: Secondary | ICD-10-CM | POA: Diagnosis not present

## 2016-12-17 DIAGNOSIS — R059 Cough, unspecified: Secondary | ICD-10-CM

## 2016-12-31 ENCOUNTER — Other Ambulatory Visit (HOSPITAL_COMMUNITY): Payer: Self-pay | Admitting: Family Medicine

## 2016-12-31 ENCOUNTER — Other Ambulatory Visit: Payer: Self-pay | Admitting: Family Medicine

## 2016-12-31 DIAGNOSIS — Z23 Encounter for immunization: Secondary | ICD-10-CM | POA: Diagnosis not present

## 2016-12-31 DIAGNOSIS — R109 Unspecified abdominal pain: Secondary | ICD-10-CM

## 2016-12-31 DIAGNOSIS — K219 Gastro-esophageal reflux disease without esophagitis: Secondary | ICD-10-CM | POA: Diagnosis not present

## 2016-12-31 DIAGNOSIS — I10 Essential (primary) hypertension: Secondary | ICD-10-CM | POA: Diagnosis not present

## 2016-12-31 DIAGNOSIS — R05 Cough: Secondary | ICD-10-CM | POA: Diagnosis not present

## 2016-12-31 DIAGNOSIS — F411 Generalized anxiety disorder: Secondary | ICD-10-CM | POA: Diagnosis not present

## 2017-01-07 ENCOUNTER — Ambulatory Visit
Admission: RE | Admit: 2017-01-07 | Discharge: 2017-01-07 | Disposition: A | Discharge status: Home / Self Care | Payer: PPO | Source: Ambulatory Visit | Attending: Family Medicine | Admitting: Family Medicine

## 2017-01-07 DIAGNOSIS — R109 Unspecified abdominal pain: Secondary | ICD-10-CM

## 2017-01-07 DIAGNOSIS — K824 Cholesterolosis of gallbladder: Secondary | ICD-10-CM | POA: Diagnosis not present

## 2017-01-12 ENCOUNTER — Ambulatory Visit (HOSPITAL_COMMUNITY): Payer: PPO

## 2017-01-16 ENCOUNTER — Other Ambulatory Visit: Payer: Self-pay | Admitting: Family Medicine

## 2017-01-16 DIAGNOSIS — N2889 Other specified disorders of kidney and ureter: Secondary | ICD-10-CM

## 2017-01-22 ENCOUNTER — Ambulatory Visit (HOSPITAL_COMMUNITY)
Admission: RE | Admit: 2017-01-22 | Discharge: 2017-01-22 | Disposition: A | Discharge status: Home / Self Care | Payer: PPO | Source: Ambulatory Visit | Attending: Family Medicine | Admitting: Family Medicine

## 2017-01-22 DIAGNOSIS — R109 Unspecified abdominal pain: Secondary | ICD-10-CM

## 2017-01-22 DIAGNOSIS — K219 Gastro-esophageal reflux disease without esophagitis: Secondary | ICD-10-CM | POA: Diagnosis not present

## 2017-01-22 DIAGNOSIS — K449 Diaphragmatic hernia without obstruction or gangrene: Secondary | ICD-10-CM | POA: Diagnosis not present

## 2017-01-31 ENCOUNTER — Ambulatory Visit
Admission: RE | Admit: 2017-01-31 | Discharge: 2017-01-31 | Disposition: A | Discharge status: Home / Self Care | Payer: PPO | Source: Ambulatory Visit | Attending: Family Medicine | Admitting: Family Medicine

## 2017-01-31 DIAGNOSIS — D1803 Hemangioma of intra-abdominal structures: Secondary | ICD-10-CM | POA: Diagnosis not present

## 2017-01-31 DIAGNOSIS — N2889 Other specified disorders of kidney and ureter: Secondary | ICD-10-CM

## 2017-01-31 MED ORDER — GADOBENATE DIMEGLUMINE 529 MG/ML IV SOLN
18.0000 mL | Freq: Once | INTRAVENOUS | Status: AC | PRN
  Administered 2017-01-31: 18 mL via INTRAVENOUS

## 2017-02-09 DIAGNOSIS — K219 Gastro-esophageal reflux disease without esophagitis: Secondary | ICD-10-CM | POA: Diagnosis not present

## 2017-02-09 DIAGNOSIS — R739 Hyperglycemia, unspecified: Secondary | ICD-10-CM | POA: Diagnosis not present

## 2017-02-11 DIAGNOSIS — K219 Gastro-esophageal reflux disease without esophagitis: Secondary | ICD-10-CM | POA: Diagnosis not present

## 2017-02-11 DIAGNOSIS — J449 Chronic obstructive pulmonary disease, unspecified: Secondary | ICD-10-CM | POA: Diagnosis not present

## 2017-02-11 DIAGNOSIS — I10 Essential (primary) hypertension: Secondary | ICD-10-CM | POA: Diagnosis not present

## 2017-02-11 DIAGNOSIS — R05 Cough: Secondary | ICD-10-CM | POA: Diagnosis not present

## 2017-04-10 DIAGNOSIS — D2261 Melanocytic nevi of right upper limb, including shoulder: Secondary | ICD-10-CM | POA: Diagnosis not present

## 2017-04-10 DIAGNOSIS — L821 Other seborrheic keratosis: Secondary | ICD-10-CM | POA: Diagnosis not present

## 2017-04-10 DIAGNOSIS — D2239 Melanocytic nevi of other parts of face: Secondary | ICD-10-CM | POA: Diagnosis not present

## 2017-04-10 DIAGNOSIS — D1801 Hemangioma of skin and subcutaneous tissue: Secondary | ICD-10-CM | POA: Diagnosis not present

## 2017-04-10 DIAGNOSIS — L918 Other hypertrophic disorders of the skin: Secondary | ICD-10-CM | POA: Diagnosis not present

## 2017-04-10 DIAGNOSIS — L814 Other melanin hyperpigmentation: Secondary | ICD-10-CM | POA: Diagnosis not present

## 2017-04-10 DIAGNOSIS — L820 Inflamed seborrheic keratosis: Secondary | ICD-10-CM | POA: Diagnosis not present

## 2017-04-10 DIAGNOSIS — L813 Cafe au lait spots: Secondary | ICD-10-CM | POA: Diagnosis not present

## 2017-04-10 DIAGNOSIS — D224 Melanocytic nevi of scalp and neck: Secondary | ICD-10-CM | POA: Diagnosis not present

## 2017-04-28 DIAGNOSIS — Z6830 Body mass index (BMI) 30.0-30.9, adult: Secondary | ICD-10-CM | POA: Diagnosis not present

## 2017-04-28 DIAGNOSIS — I10 Essential (primary) hypertension: Secondary | ICD-10-CM | POA: Diagnosis not present

## 2017-04-28 DIAGNOSIS — R05 Cough: Secondary | ICD-10-CM | POA: Diagnosis not present

## 2017-04-28 DIAGNOSIS — J3089 Other allergic rhinitis: Secondary | ICD-10-CM | POA: Diagnosis not present

## 2017-07-07 DIAGNOSIS — J189 Pneumonia, unspecified organism: Secondary | ICD-10-CM | POA: Diagnosis not present

## 2017-07-07 DIAGNOSIS — J3089 Other allergic rhinitis: Secondary | ICD-10-CM | POA: Diagnosis not present

## 2017-07-07 DIAGNOSIS — J069 Acute upper respiratory infection, unspecified: Secondary | ICD-10-CM | POA: Diagnosis not present

## 2017-07-07 DIAGNOSIS — Z6832 Body mass index (BMI) 32.0-32.9, adult: Secondary | ICD-10-CM | POA: Diagnosis not present

## 2017-07-07 DIAGNOSIS — K219 Gastro-esophageal reflux disease without esophagitis: Secondary | ICD-10-CM | POA: Diagnosis not present

## 2017-07-13 DIAGNOSIS — J189 Pneumonia, unspecified organism: Secondary | ICD-10-CM | POA: Diagnosis not present

## 2017-07-13 DIAGNOSIS — R05 Cough: Secondary | ICD-10-CM | POA: Diagnosis not present

## 2017-07-13 DIAGNOSIS — R197 Diarrhea, unspecified: Secondary | ICD-10-CM | POA: Diagnosis not present

## 2017-07-13 DIAGNOSIS — Z6829 Body mass index (BMI) 29.0-29.9, adult: Secondary | ICD-10-CM | POA: Diagnosis not present

## 2017-07-27 DIAGNOSIS — Z6829 Body mass index (BMI) 29.0-29.9, adult: Secondary | ICD-10-CM | POA: Diagnosis not present

## 2017-07-27 DIAGNOSIS — R5383 Other fatigue: Secondary | ICD-10-CM | POA: Diagnosis not present

## 2017-07-27 DIAGNOSIS — J189 Pneumonia, unspecified organism: Secondary | ICD-10-CM | POA: Diagnosis not present

## 2017-07-27 DIAGNOSIS — R197 Diarrhea, unspecified: Secondary | ICD-10-CM | POA: Diagnosis not present

## 2017-07-30 DIAGNOSIS — Z1231 Encounter for screening mammogram for malignant neoplasm of breast: Secondary | ICD-10-CM | POA: Diagnosis not present

## 2017-09-02 DIAGNOSIS — L820 Inflamed seborrheic keratosis: Secondary | ICD-10-CM | POA: Diagnosis not present

## 2017-09-02 DIAGNOSIS — D0361 Melanoma in situ of right upper limb, including shoulder: Secondary | ICD-10-CM | POA: Diagnosis not present

## 2017-09-02 DIAGNOSIS — D485 Neoplasm of uncertain behavior of skin: Secondary | ICD-10-CM | POA: Diagnosis not present

## 2017-09-21 DIAGNOSIS — L988 Other specified disorders of the skin and subcutaneous tissue: Secondary | ICD-10-CM | POA: Diagnosis not present

## 2017-09-21 DIAGNOSIS — D0361 Melanoma in situ of right upper limb, including shoulder: Secondary | ICD-10-CM | POA: Diagnosis not present

## 2017-11-03 DIAGNOSIS — Z23 Encounter for immunization: Secondary | ICD-10-CM | POA: Diagnosis not present

## 2017-11-19 DIAGNOSIS — D2271 Melanocytic nevi of right lower limb, including hip: Secondary | ICD-10-CM | POA: Diagnosis not present

## 2017-11-19 DIAGNOSIS — D2262 Melanocytic nevi of left upper limb, including shoulder: Secondary | ICD-10-CM | POA: Diagnosis not present

## 2017-11-19 DIAGNOSIS — Z85820 Personal history of malignant melanoma of skin: Secondary | ICD-10-CM | POA: Diagnosis not present

## 2017-11-19 DIAGNOSIS — L570 Actinic keratosis: Secondary | ICD-10-CM | POA: Diagnosis not present

## 2017-11-19 DIAGNOSIS — L814 Other melanin hyperpigmentation: Secondary | ICD-10-CM | POA: Diagnosis not present

## 2017-11-19 DIAGNOSIS — L918 Other hypertrophic disorders of the skin: Secondary | ICD-10-CM | POA: Diagnosis not present

## 2017-11-19 DIAGNOSIS — D1801 Hemangioma of skin and subcutaneous tissue: Secondary | ICD-10-CM | POA: Diagnosis not present

## 2017-11-19 DIAGNOSIS — L820 Inflamed seborrheic keratosis: Secondary | ICD-10-CM | POA: Diagnosis not present

## 2017-11-19 DIAGNOSIS — L818 Other specified disorders of pigmentation: Secondary | ICD-10-CM | POA: Diagnosis not present

## 2017-11-19 DIAGNOSIS — L821 Other seborrheic keratosis: Secondary | ICD-10-CM | POA: Diagnosis not present

## 2017-11-19 DIAGNOSIS — D485 Neoplasm of uncertain behavior of skin: Secondary | ICD-10-CM | POA: Diagnosis not present

## 2017-12-02 DIAGNOSIS — K219 Gastro-esophageal reflux disease without esophagitis: Secondary | ICD-10-CM | POA: Diagnosis not present

## 2017-12-02 DIAGNOSIS — J3089 Other allergic rhinitis: Secondary | ICD-10-CM | POA: Diagnosis not present

## 2017-12-02 DIAGNOSIS — R05 Cough: Secondary | ICD-10-CM | POA: Diagnosis not present

## 2017-12-02 DIAGNOSIS — E559 Vitamin D deficiency, unspecified: Secondary | ICD-10-CM | POA: Diagnosis not present

## 2017-12-02 DIAGNOSIS — Z6829 Body mass index (BMI) 29.0-29.9, adult: Secondary | ICD-10-CM | POA: Diagnosis not present

## 2017-12-02 DIAGNOSIS — Z0000 Encounter for general adult medical examination without abnormal findings: Secondary | ICD-10-CM | POA: Diagnosis not present

## 2017-12-02 DIAGNOSIS — M8580 Other specified disorders of bone density and structure, unspecified site: Secondary | ICD-10-CM | POA: Diagnosis not present

## 2018-03-24 DIAGNOSIS — L821 Other seborrheic keratosis: Secondary | ICD-10-CM | POA: Diagnosis not present

## 2018-03-24 DIAGNOSIS — D2271 Melanocytic nevi of right lower limb, including hip: Secondary | ICD-10-CM | POA: Diagnosis not present

## 2018-03-24 DIAGNOSIS — D2272 Melanocytic nevi of left lower limb, including hip: Secondary | ICD-10-CM | POA: Diagnosis not present

## 2018-03-24 DIAGNOSIS — L814 Other melanin hyperpigmentation: Secondary | ICD-10-CM | POA: Diagnosis not present

## 2018-03-24 DIAGNOSIS — D2372 Other benign neoplasm of skin of left lower limb, including hip: Secondary | ICD-10-CM | POA: Diagnosis not present

## 2018-03-24 DIAGNOSIS — L820 Inflamed seborrheic keratosis: Secondary | ICD-10-CM | POA: Diagnosis not present

## 2018-03-24 DIAGNOSIS — L918 Other hypertrophic disorders of the skin: Secondary | ICD-10-CM | POA: Diagnosis not present

## 2018-03-24 DIAGNOSIS — I788 Other diseases of capillaries: Secondary | ICD-10-CM | POA: Diagnosis not present

## 2018-03-24 DIAGNOSIS — D2262 Melanocytic nevi of left upper limb, including shoulder: Secondary | ICD-10-CM | POA: Diagnosis not present

## 2018-03-24 DIAGNOSIS — Z85820 Personal history of malignant melanoma of skin: Secondary | ICD-10-CM | POA: Diagnosis not present

## 2018-03-31 DIAGNOSIS — Z6829 Body mass index (BMI) 29.0-29.9, adult: Secondary | ICD-10-CM | POA: Diagnosis not present

## 2018-03-31 DIAGNOSIS — J3089 Other allergic rhinitis: Secondary | ICD-10-CM | POA: Diagnosis not present

## 2018-03-31 DIAGNOSIS — K219 Gastro-esophageal reflux disease without esophagitis: Secondary | ICD-10-CM | POA: Diagnosis not present

## 2018-03-31 DIAGNOSIS — I10 Essential (primary) hypertension: Secondary | ICD-10-CM | POA: Diagnosis not present

## 2018-09-02 ENCOUNTER — Other Ambulatory Visit: Payer: Self-pay

## 2018-10-14 DIAGNOSIS — L905 Scar conditions and fibrosis of skin: Secondary | ICD-10-CM | POA: Diagnosis not present

## 2018-10-14 DIAGNOSIS — L820 Inflamed seborrheic keratosis: Secondary | ICD-10-CM | POA: Diagnosis not present

## 2018-10-14 DIAGNOSIS — D225 Melanocytic nevi of trunk: Secondary | ICD-10-CM | POA: Diagnosis not present

## 2018-10-14 DIAGNOSIS — Z85820 Personal history of malignant melanoma of skin: Secondary | ICD-10-CM | POA: Diagnosis not present

## 2018-10-14 DIAGNOSIS — D1801 Hemangioma of skin and subcutaneous tissue: Secondary | ICD-10-CM | POA: Diagnosis not present

## 2018-10-14 DIAGNOSIS — L821 Other seborrheic keratosis: Secondary | ICD-10-CM | POA: Diagnosis not present

## 2018-10-14 DIAGNOSIS — D2372 Other benign neoplasm of skin of left lower limb, including hip: Secondary | ICD-10-CM | POA: Diagnosis not present

## 2018-10-14 DIAGNOSIS — L814 Other melanin hyperpigmentation: Secondary | ICD-10-CM | POA: Diagnosis not present

## 2018-10-14 DIAGNOSIS — L570 Actinic keratosis: Secondary | ICD-10-CM | POA: Diagnosis not present

## 2018-10-14 DIAGNOSIS — D2272 Melanocytic nevi of left lower limb, including hip: Secondary | ICD-10-CM | POA: Diagnosis not present

## 2018-10-14 DIAGNOSIS — L918 Other hypertrophic disorders of the skin: Secondary | ICD-10-CM | POA: Diagnosis not present

## 2018-10-19 DIAGNOSIS — Z23 Encounter for immunization: Secondary | ICD-10-CM | POA: Diagnosis not present

## 2018-11-04 DIAGNOSIS — H269 Unspecified cataract: Secondary | ICD-10-CM

## 2018-11-04 HISTORY — DX: Unspecified cataract: H26.9

## 2018-12-27 ENCOUNTER — Other Ambulatory Visit: Payer: Self-pay

## 2018-12-27 DIAGNOSIS — Z20822 Contact with and (suspected) exposure to covid-19: Secondary | ICD-10-CM

## 2018-12-29 LAB — NOVEL CORONAVIRUS, NAA: SARS-CoV-2, NAA: NOT DETECTED

## 2019-03-06 ENCOUNTER — Ambulatory Visit: Payer: PPO

## 2019-03-07 DIAGNOSIS — I10 Essential (primary) hypertension: Secondary | ICD-10-CM | POA: Diagnosis not present

## 2019-03-07 DIAGNOSIS — K219 Gastro-esophageal reflux disease without esophagitis: Secondary | ICD-10-CM | POA: Diagnosis not present

## 2019-03-07 DIAGNOSIS — E559 Vitamin D deficiency, unspecified: Secondary | ICD-10-CM | POA: Diagnosis not present

## 2019-03-07 DIAGNOSIS — M549 Dorsalgia, unspecified: Secondary | ICD-10-CM | POA: Diagnosis not present

## 2019-03-11 ENCOUNTER — Ambulatory Visit: Payer: PPO

## 2019-03-14 ENCOUNTER — Ambulatory Visit: Payer: PPO | Attending: Internal Medicine

## 2019-03-14 DIAGNOSIS — Z23 Encounter for immunization: Secondary | ICD-10-CM | POA: Insufficient documentation

## 2019-03-14 NOTE — Progress Notes (Signed)
   Covid-19 Vaccination Clinic  Name:  Karen Mcclain    MRN: [id number] DOB: 1945-02-14  03/14/2019  Karen Mcclain was observed post Covid-19 immunization for 15 minutes without incidence. She was provided with Vaccine Information Sheet and instruction to access the V-Safe system.   Karen Mcclain was instructed to call 911 with any severe reactions post vaccine: Marland Kitchen Difficulty breathing  . Swelling of your face and throat  . A fast heartbeat  . A bad rash all over your body  . Dizziness and weakness    Immunizations Administered    Name Date Dose VIS Date Route   Pfizer COVID-19 Vaccine 03/14/2019  5:48 PM 0.3 mL 01/14/2019 Intramuscular   Manufacturer: [place]   Lot: [id number]   [place]: [id number]

## 2019-04-08 ENCOUNTER — Ambulatory Visit: Payer: PPO | Attending: Internal Medicine

## 2019-04-08 DIAGNOSIS — Z23 Encounter for immunization: Secondary | ICD-10-CM

## 2019-04-08 NOTE — Progress Notes (Signed)
   Covid-19 Vaccination Clinic  Name:  Karen Mcclain    MRN: [id number] DOB: 10-28-1945  04/08/2019  [name] was observed post Covid-19 immunization for 15 minutes without incident. She was provided with Vaccine Information Sheet and instruction to access the V-Safe system.   [name] was instructed to call 911 with any severe reactions post vaccine: Marland Kitchen Difficulty breathing  . Swelling of face and throat  . A fast heartbeat  . A bad rash all over body  . Dizziness and weakness   Immunizations Administered    Name Date Dose VIS Date Route   Pfizer COVID-19 Vaccine 04/08/2019  4:45 PM 0.3 mL 01/14/2019 Intramuscular   Manufacturer: [place]   Lot: [id number]   [employer]: [id number]

## 2019-04-22 DIAGNOSIS — E559 Vitamin D deficiency, unspecified: Secondary | ICD-10-CM | POA: Diagnosis not present

## 2019-04-22 DIAGNOSIS — K219 Gastro-esophageal reflux disease without esophagitis: Secondary | ICD-10-CM | POA: Diagnosis not present

## 2019-04-22 DIAGNOSIS — I10 Essential (primary) hypertension: Secondary | ICD-10-CM | POA: Diagnosis not present

## 2019-04-22 DIAGNOSIS — M810 Age-related osteoporosis without current pathological fracture: Secondary | ICD-10-CM | POA: Diagnosis not present

## 2019-04-22 DIAGNOSIS — R5383 Other fatigue: Secondary | ICD-10-CM | POA: Diagnosis not present

## 2019-04-27 DIAGNOSIS — L821 Other seborrheic keratosis: Secondary | ICD-10-CM | POA: Diagnosis not present

## 2019-04-27 DIAGNOSIS — L918 Other hypertrophic disorders of the skin: Secondary | ICD-10-CM | POA: Diagnosis not present

## 2019-04-27 DIAGNOSIS — D2372 Other benign neoplasm of skin of left lower limb, including hip: Secondary | ICD-10-CM | POA: Diagnosis not present

## 2019-04-27 DIAGNOSIS — L820 Inflamed seborrheic keratosis: Secondary | ICD-10-CM | POA: Diagnosis not present

## 2019-04-27 DIAGNOSIS — D2261 Melanocytic nevi of right upper limb, including shoulder: Secondary | ICD-10-CM | POA: Diagnosis not present

## 2019-04-27 DIAGNOSIS — D2262 Melanocytic nevi of left upper limb, including shoulder: Secondary | ICD-10-CM | POA: Diagnosis not present

## 2019-04-27 DIAGNOSIS — L814 Other melanin hyperpigmentation: Secondary | ICD-10-CM | POA: Diagnosis not present

## 2019-04-27 DIAGNOSIS — L812 Freckles: Secondary | ICD-10-CM | POA: Diagnosis not present

## 2019-04-27 DIAGNOSIS — B0089 Other herpesviral infection: Secondary | ICD-10-CM | POA: Diagnosis not present

## 2019-04-27 DIAGNOSIS — L0889 Other specified local infections of the skin and subcutaneous tissue: Secondary | ICD-10-CM | POA: Diagnosis not present

## 2019-04-27 DIAGNOSIS — L720 Epidermal cyst: Secondary | ICD-10-CM | POA: Diagnosis not present

## 2019-04-27 DIAGNOSIS — D2272 Melanocytic nevi of left lower limb, including hip: Secondary | ICD-10-CM | POA: Diagnosis not present

## 2019-04-27 DIAGNOSIS — Z85820 Personal history of malignant melanoma of skin: Secondary | ICD-10-CM | POA: Diagnosis not present

## 2019-05-26 DIAGNOSIS — E782 Mixed hyperlipidemia: Secondary | ICD-10-CM | POA: Diagnosis not present

## 2019-05-26 DIAGNOSIS — I10 Essential (primary) hypertension: Secondary | ICD-10-CM | POA: Diagnosis not present

## 2019-05-26 DIAGNOSIS — K219 Gastro-esophageal reflux disease without esophagitis: Secondary | ICD-10-CM | POA: Diagnosis not present

## 2019-05-26 DIAGNOSIS — E559 Vitamin D deficiency, unspecified: Secondary | ICD-10-CM | POA: Diagnosis not present

## 2019-05-26 DIAGNOSIS — Z6828 Body mass index (BMI) 28.0-28.9, adult: Secondary | ICD-10-CM | POA: Diagnosis not present

## 2019-05-26 DIAGNOSIS — Z0000 Encounter for general adult medical examination without abnormal findings: Secondary | ICD-10-CM | POA: Diagnosis not present

## 2019-08-04 DIAGNOSIS — Z23 Encounter for immunization: Secondary | ICD-10-CM | POA: Diagnosis not present

## 2019-08-30 ENCOUNTER — Other Ambulatory Visit: Payer: Self-pay

## 2019-08-31 ENCOUNTER — Telehealth: Payer: Self-pay | Admitting: Family Medicine

## 2019-08-31 NOTE — Telephone Encounter (Signed)
I have the patient scheduled for 09/07/2019 at 3 PM and NPP sent

## 2019-08-31 NOTE — Telephone Encounter (Signed)
Okay to schedule

## 2019-08-31 NOTE — Telephone Encounter (Signed)
This patient would like to establish care with Dr. Martinique.  She was highly recommended by 2 patients of [facility].  Charlsie Merles and Tressie Ellis  Can I schedule this patient for a new patient?

## 2019-09-07 ENCOUNTER — Encounter: Payer: Self-pay | Admitting: Family Medicine

## 2019-09-07 ENCOUNTER — Other Ambulatory Visit: Payer: Self-pay

## 2019-09-07 ENCOUNTER — Ambulatory Visit (INDEPENDENT_AMBULATORY_CARE_PROVIDER_SITE_OTHER): Payer: PPO | Admitting: Family Medicine

## 2019-09-07 VITALS — BP 138/80 | HR 91 | Temp 98.4°F | Resp 16 | Ht 66.0 in | Wt 178.2 lb

## 2019-09-07 DIAGNOSIS — E559 Vitamin D deficiency, unspecified: Secondary | ICD-10-CM

## 2019-09-07 DIAGNOSIS — I10 Essential (primary) hypertension: Secondary | ICD-10-CM | POA: Insufficient documentation

## 2019-09-07 DIAGNOSIS — M810 Age-related osteoporosis without current pathological fracture: Secondary | ICD-10-CM

## 2019-09-07 DIAGNOSIS — E785 Hyperlipidemia, unspecified: Secondary | ICD-10-CM | POA: Diagnosis not present

## 2019-09-07 DIAGNOSIS — M816 Localized osteoporosis [Lequesne]: Secondary | ICD-10-CM

## 2019-09-07 DIAGNOSIS — Z780 Asymptomatic menopausal state: Secondary | ICD-10-CM | POA: Diagnosis not present

## 2019-09-07 HISTORY — DX: Age-related osteoporosis without current pathological fracture: M81.0

## 2019-09-07 HISTORY — DX: Hyperlipidemia, unspecified: E78.5

## 2019-09-07 MED ORDER — LOSARTAN POTASSIUM 50 MG PO TABS: 50.0000 mg | ORAL_TABLET | Freq: Every day | ORAL | 2 refills | Status: DC

## 2019-09-07 NOTE — Progress Notes (Signed)
HPI: [name] is a 74 y.o. female, who is here today to establish care.  Former PCP: Dr. Ernie Mcclain Last preventive routine visit:05/26/19  Chronic medical problems: HTN,HLD, vit D deficiency,and GERD among some. She lives alone.  HTN: Dx'ed in 2016. She is on Losartan 50 mg daily. Tolerating medication well. Negative for severe/frequent headache, visual changes, chest pain, dyspnea, palpitation, claudication, focal weakness, or edema. 04/22/19: Cr 0.88  HLD: She is on non pharmacologic treatment. She is exercising regularly, trying to walk 15 min daily. Vit D deficiency: She is on Vit D 5000 U daily.  Osteoporosis: She took Fosamax from 07/2002 to 05/2008. No hx of pathologic fracture. Last DEXA 2018. She is on Ca++ and vit D supplementation.  Review of Systems  Constitutional: Negative for activity change, appetite change and fever.  HENT: Negative for mouth sores, nosebleeds and sore throat.   Eyes: Negative for redness and visual disturbance.  Respiratory: Negative for cough and wheezing.   Gastrointestinal: Negative for abdominal pain, nausea and vomiting.       Negative for changes in bowel habits.  Genitourinary: Negative for decreased urine volume and hematuria.  Neurological: Negative for syncope, facial asymmetry and weakness.  Psychiatric/Behavioral: Negative for confusion. The patient is nervous/anxious.   Rest see pertinent positives and negatives per HPI.  Current Outpatient Medications on File Prior to Visit  Medication Sig Dispense Refill  . Cholecalciferol (VITAMIN D3) 400 UNITS CAPS Take by mouth.    . Multiple Vitamin (MULTIVITAMIN) tablet Take 1 tablet by mouth.     No current facility-administered medications on file prior to visit.   Past Medical History:  Diagnosis Date  . Cataract 11/2018  . GERD (gastroesophageal reflux disease)   . Hyperlipidemia 09/07/2019  . Hypertension 03/2015  . Osteoporosis 09/07/2019   No Known  Allergies  Family History  Problem Relation Age of Onset  . Heart disease Father   . Heart disease Mother    Social History   Socioeconomic History  . Marital status: Divorced    Spouse name: Not on file  . Number of children: Not on file  . Years of education: Not on file  . Highest education level: Not on file  Occupational History  . Not on file  Tobacco Use  . Smoking status: Never Smoker  . Smokeless tobacco: Never Used  Substance and Sexual Activity  . Alcohol use: Yes    Alcohol/week: 0.0 standard drinks    Comment: on weekends - 2 glasses wine.  . Drug use: No  . Sexual activity: Not on file  Other Topics Concern  . Not on file  Social History Narrative  . Not on file   Social Determinants of Health   Financial Resource Strain:   . Difficulty of Paying Living Expenses:   Food Insecurity:   . Worried About Charity fundraiser in the Last Year:   . Arboriculturist in the Last Year:   Transportation Needs:   . Film/video editor (Medical):   Marland Kitchen Lack of Transportation (Non-Medical):   Physical Activity:   . Days of Exercise per Week:   . Minutes of Exercise per Session:   Stress:   . Feeling of Stress :   Social Connections:   . Frequency of Communication with Friends and Family:   . Frequency of Social Gatherings with Friends and Family:   . Attends Religious Services:   . Active Member of Clubs or Organizations:   . Attends  Club or Organization Meetings:   Marland Kitchen Marital Status:     Vitals:   09/07/19 1443  BP: 138/80  Pulse: 91  Resp: 16  Temp: 98.4 F (36.9 C)  SpO2: 97%    Body mass index is 28.77 kg/m.  Physical Exam Vitals and nursing note reviewed.  Constitutional:      General: She is not in acute distress.    Appearance: She is well-developed.  HENT:     Head: Normocephalic and atraumatic.  Eyes:     Conjunctiva/sclera: Conjunctivae normal.     Pupils: Pupils are equal, round, and reactive to light.  Cardiovascular:     Rate  and Rhythm: Normal rate and regular rhythm.     Pulses:          Dorsalis pedis pulses are 2+ on the right side and 2+ on the left side.     Heart sounds: No murmur heard.   Pulmonary:     Effort: Pulmonary effort is normal. No respiratory distress.     Breath sounds: Normal breath sounds.  Abdominal:     Palpations: Abdomen is soft. There is no hepatomegaly or mass.     Tenderness: There is no abdominal tenderness.  Lymphadenopathy:     Cervical: No cervical adenopathy.  Skin:    General: Skin is warm.     Findings: No erythema or rash.  Neurological:     Mental Status: She is alert and oriented to person, place, and time.     Cranial Nerves: No cranial nerve deficit.     Gait: Gait normal.  Psychiatric:     Comments: Well groomed, good eye contact.    ASSESSMENT AND PLAN:  [name] was seen today for establish care.  Diagnoses and all orders for this visit: Orders Placed This Encounter  Procedures  . DG Bone Density  . BASIC METABOLIC PANEL WITH GFR  . Lipid panel  . VITAMIN D 25 Hydroxy (Vit-D Deficiency, Fractures)    Hyperlipidemia, unspecified hyperlipidemia type Continue non pharmacologic treatment. She will come back next week for fastin labs.  Asymptomatic postmenopausal estrogen deficiency -     DG Bone Density; Future  Hypertension, essential, benign BP adequate controlled. No changes in current management. Continue low salt diet.  -     losartan (COZAAR) 50 MG tablet; Take 1 tablet (50 mg total) by mouth daily.  Vitamin D deficiency, unspecified Continue vit D 5000 U daily. Further recommendations according to 25 OH vit D result.  Localized osteoporosis without current pathological fracture Continue Ca++ and vit D supplementation. Fall precautions and continue regular physical activity.  Return for fasting labs. .    G. Martinique, MD  [facility] [place] office.   A few things to remember from today's visit:   Fasting  labs will be arranged. Please signs a release form, so we can obtain results of labs and colonoscopy.  If you need refills please call your pharmacy. Do not use My Chart to request refills or for acute issues that need immediate attention.    Please be sure medication list is accurate. If a new problem present, please set up appointment sooner than planned today.

## 2019-09-07 NOTE — Patient Instructions (Signed)
A few things to remember from today's visit:   Fasting labs will be arranged. Please signs a release form, so we can obtain results of labs and colonoscopy.  If you need refills please call your pharmacy. Do not use My Chart to request refills or for acute issues that need immediate attention.    Please be sure medication list is accurate. If a new problem present, please set up appointment sooner than planned today.

## 2019-09-10 ENCOUNTER — Encounter: Payer: Self-pay | Admitting: Family Medicine

## 2019-09-13 ENCOUNTER — Encounter: Payer: Self-pay | Admitting: Family Medicine

## 2019-09-13 ENCOUNTER — Other Ambulatory Visit: Payer: PPO

## 2019-09-13 DIAGNOSIS — M85852 Other specified disorders of bone density and structure, left thigh: Secondary | ICD-10-CM | POA: Diagnosis not present

## 2019-09-13 DIAGNOSIS — M85851 Other specified disorders of bone density and structure, right thigh: Secondary | ICD-10-CM | POA: Diagnosis not present

## 2019-09-13 DIAGNOSIS — Z1231 Encounter for screening mammogram for malignant neoplasm of breast: Secondary | ICD-10-CM | POA: Diagnosis not present

## 2019-09-13 DIAGNOSIS — M810 Age-related osteoporosis without current pathological fracture: Secondary | ICD-10-CM | POA: Diagnosis not present

## 2019-09-15 ENCOUNTER — Other Ambulatory Visit: Payer: Self-pay

## 2019-09-15 ENCOUNTER — Other Ambulatory Visit (INDEPENDENT_AMBULATORY_CARE_PROVIDER_SITE_OTHER): Payer: PPO

## 2019-09-15 DIAGNOSIS — I10 Essential (primary) hypertension: Secondary | ICD-10-CM

## 2019-09-15 DIAGNOSIS — E559 Vitamin D deficiency, unspecified: Secondary | ICD-10-CM | POA: Diagnosis not present

## 2019-09-15 DIAGNOSIS — E785 Hyperlipidemia, unspecified: Secondary | ICD-10-CM

## 2019-09-15 LAB — LIPID PANEL
Cholesterol: 190 mg/dL (ref <200)
HDL: 87 mg/dL (ref >=50)
LDL Cholesterol (Calc): 83 mg/dL (calc)
Non-HDL Cholesterol (Calc): 103 mg/dL (calc) (ref <130)
Total CHOL/HDL Ratio: 2.2 (calc) (ref <5.0)
Triglycerides: 102 mg/dL (ref <150)

## 2019-09-15 LAB — VITAMIN D 25 HYDROXY (VIT D DEFICIENCY, FRACTURES): Vit D, 25-Hydroxy: 63 ng/mL (ref 30–100)

## 2019-09-15 LAB — BASIC METABOLIC PANEL WITH GFR
BUN: 17 mg/dL (ref 7–25)
CO2: 29 mmol/L (ref 20–32)
Calcium: 9.5 mg/dL (ref 8.6–10.4)
Chloride: 102 mmol/L (ref 98–110)
Creat: 0.78 mg/dL (ref 0.60–0.93)
GFR, Est African American: 86 mL/min/{1.73_m2} (ref >=60)
GFR, Est Non African American: 74 mL/min/{1.73_m2} (ref >=60)
Glucose, Bld: 91 mg/dL (ref 65–99)
Potassium: 4.5 mmol/L (ref 3.5–5.3)
Sodium: 138 mmol/L (ref 135–146)

## 2019-10-07 ENCOUNTER — Encounter: Payer: Self-pay | Admitting: Family Medicine

## 2019-10-11 ENCOUNTER — Telehealth: Payer: Self-pay

## 2019-10-11 NOTE — Telephone Encounter (Signed)
Can you help pt schedule a nurse visit for a high dose flu vaccine? Thank you!

## 2019-10-20 ENCOUNTER — Ambulatory Visit (INDEPENDENT_AMBULATORY_CARE_PROVIDER_SITE_OTHER): Payer: PPO

## 2019-10-20 ENCOUNTER — Other Ambulatory Visit: Payer: Self-pay

## 2019-10-20 DIAGNOSIS — Z23 Encounter for immunization: Secondary | ICD-10-CM

## 2019-10-23 ENCOUNTER — Encounter: Payer: Self-pay | Admitting: Family Medicine

## 2019-10-24 ENCOUNTER — Encounter: Payer: Self-pay | Admitting: Family Medicine

## 2019-10-26 DIAGNOSIS — D2261 Melanocytic nevi of right upper limb, including shoulder: Secondary | ICD-10-CM | POA: Diagnosis not present

## 2019-10-26 DIAGNOSIS — D225 Melanocytic nevi of trunk: Secondary | ICD-10-CM | POA: Diagnosis not present

## 2019-10-26 DIAGNOSIS — D2272 Melanocytic nevi of left lower limb, including hip: Secondary | ICD-10-CM | POA: Diagnosis not present

## 2019-10-26 DIAGNOSIS — L814 Other melanin hyperpigmentation: Secondary | ICD-10-CM | POA: Diagnosis not present

## 2019-10-26 DIAGNOSIS — D1801 Hemangioma of skin and subcutaneous tissue: Secondary | ICD-10-CM | POA: Diagnosis not present

## 2019-10-26 DIAGNOSIS — Z85820 Personal history of malignant melanoma of skin: Secondary | ICD-10-CM | POA: Diagnosis not present

## 2019-10-26 DIAGNOSIS — L810 Postinflammatory hyperpigmentation: Secondary | ICD-10-CM | POA: Diagnosis not present

## 2019-10-26 DIAGNOSIS — L918 Other hypertrophic disorders of the skin: Secondary | ICD-10-CM | POA: Diagnosis not present

## 2019-10-26 DIAGNOSIS — D2271 Melanocytic nevi of right lower limb, including hip: Secondary | ICD-10-CM | POA: Diagnosis not present

## 2019-10-26 DIAGNOSIS — D2372 Other benign neoplasm of skin of left lower limb, including hip: Secondary | ICD-10-CM | POA: Diagnosis not present

## 2019-10-26 DIAGNOSIS — L821 Other seborrheic keratosis: Secondary | ICD-10-CM | POA: Diagnosis not present

## 2019-12-07 DIAGNOSIS — H2513 Age-related nuclear cataract, bilateral: Secondary | ICD-10-CM | POA: Diagnosis not present

## 2019-12-07 DIAGNOSIS — H5201 Hypermetropia, right eye: Secondary | ICD-10-CM | POA: Diagnosis not present

## 2019-12-14 ENCOUNTER — Ambulatory Visit: Payer: PPO | Attending: Internal Medicine

## 2019-12-14 DIAGNOSIS — Z23 Encounter for immunization: Secondary | ICD-10-CM

## 2019-12-14 NOTE — Progress Notes (Signed)
   Covid-19 Vaccination Clinic  Name:  Karen Mcclain    MRN: [id number] DOB: 12-16-1945  12/14/2019  [name] was observed post Covid-19 immunization for 15 minutes without incident. She was provided with Vaccine Information Sheet and instruction to access the V-Safe system.   [name] was instructed to call 911 with any severe reactions post vaccine: Marland Kitchen Difficulty breathing  . Swelling of face and throat  . A fast heartbeat  . A bad rash all over body  . Dizziness and weakness

## 2019-12-15 ENCOUNTER — Other Ambulatory Visit (HOSPITAL_COMMUNITY): Payer: Self-pay | Admitting: Internal Medicine

## 2020-02-09 ENCOUNTER — Other Ambulatory Visit: Payer: Self-pay

## 2020-02-09 ENCOUNTER — Telehealth: Payer: Self-pay

## 2020-02-09 ENCOUNTER — Ambulatory Visit: Payer: PPO

## 2020-02-09 NOTE — Telephone Encounter (Signed)
According to records she needs pneumovax. Thanks, BJ

## 2020-02-09 NOTE — Telephone Encounter (Signed)
Mychart recommends paitent get pneumonia vaccine.  Patient came to office to get vaccine but unclear which she needed.  Please advise if patient needs a pneumonia vaccine and which one.

## 2020-02-10 NOTE — Telephone Encounter (Signed)
Is it appropriate for patient to come to the office to receive vaccine?

## 2020-02-13 NOTE — Telephone Encounter (Signed)
Okay to schedule pt for her pneumonia vaccine (Pneumovax)

## 2020-02-28 NOTE — Telephone Encounter (Signed)
LMVM to contact the office to schedule a nurse visit for a pneumonia vaccine

## 2020-02-29 NOTE — Telephone Encounter (Signed)
Patient is scheduled for 03/02/2020 at 3:30

## 2020-03-02 ENCOUNTER — Ambulatory Visit (INDEPENDENT_AMBULATORY_CARE_PROVIDER_SITE_OTHER): Payer: PPO

## 2020-03-02 ENCOUNTER — Other Ambulatory Visit: Payer: Self-pay

## 2020-03-02 DIAGNOSIS — Z23 Encounter for immunization: Secondary | ICD-10-CM

## 2020-03-02 NOTE — Progress Notes (Signed)
Pt came in for her pneumonia vaccine. Vaccine given in the left deltoid, pt tolerated injection well.

## 2020-04-16 NOTE — Progress Notes (Signed)
Subjective:   Karen Mcclain is a 75 y.o. female who presents for an Initial Medicare Annual Wellness Visit.  I connected with Karen Mcclain  today by telephone and verified that I am speaking with the correct person using two identifiers. Location patient: home Location provider: work Persons participating in the virtual visit: patient, provider.   I discussed the limitations, risks, security and privacy concerns of performing an evaluation and management service by telephone and the availability of in person appointments. I also discussed with the patient that there may be a patient responsible charge related to this service. The patient expressed understanding and verbally consented to this telephonic visit.    Interactive audio and video telecommunications were attempted between this provider and patient, however failed, due to patient having technical difficulties OR patient did not have access to video capability.  We continued and completed visit with audio only.      Review of Systems    N/A  Cardiac Risk Factors include: advanced age (>47men, >46 women);hypertension;dyslipidemia     Objective:    Today's Vitals   04/17/20 1446  PainSc: 4    There is no height or weight on file to calculate BMI.  Advanced Directives 04/17/2020  Does Patient Have a Medical Advance Directive? Yes  Type of Paramedic of [place];Living will  Does patient want to make changes to medical advance directive? No - Patient declined  Copy of [place] in Chart? No - copy requested    Current Medications (verified) Outpatient Encounter Medications as of 04/17/2020  Medication Sig  . losartan (COZAAR) 50 MG tablet Take 1 tablet (50 mg total) by mouth daily.  . Multiple Vitamin (MULTIVITAMIN) tablet Take 1 tablet by mouth.  . Multiple Vitamins-Minerals (VITAMIN D3 COMPLETE PO) Take by mouth.  . [DISCONTINUED] Cholecalciferol (VITAMIN D3) 400  UNITS CAPS Take by mouth.   No facility-administered encounter medications on file as of 04/17/2020.    Allergies (verified) Patient has no known allergies.   History: Past Medical History:  Diagnosis Date  . Cataract 11/2018  . GERD (gastroesophageal reflux disease)   . Hyperlipidemia 09/07/2019  . Hypertension 03/2015  . Osteoporosis 09/07/2019   Past Surgical History:  Procedure Laterality Date  . TUBAL LIGATION     Family History  Problem Relation Age of Onset  . Heart disease Father   . Heart disease Mother    Social History   Socioeconomic History  . Marital status: Divorced    Spouse name: Not on file  . Number of children: Not on file  . Years of education: Not on file  . Highest education level: Not on file  Occupational History  . Not on file  Tobacco Use  . Smoking status: Never Smoker  . Smokeless tobacco: Never Used  Substance and Sexual Activity  . Alcohol use: Yes    Alcohol/week: 0.0 standard drinks    Comment: on weekends - 2 glasses wine.  . Drug use: No  . Sexual activity: Not on file  Other Topics Concern  . Not on file  Social History Narrative  . Not on file   Social Determinants of Health   Financial Resource Strain: Low Risk   . Difficulty of Paying Living Expenses: Not hard at all  Food Insecurity: No Food Insecurity  . Worried About Charity fundraiser in the Last Year: Never true  . Ran Out of Food in the Last Year: Never true  Transportation Needs:  No Transportation Needs  . Lack of Transportation (Medical): No  . Lack of Transportation (Non-Medical): No  Physical Activity: Inactive  . Days of Exercise per Week: 0 days  . Minutes of Exercise per Session: 0 min  Stress: No Stress Concern Present  . Feeling of Stress : Not at all  Social Connections: Socially Isolated  . Frequency of Communication with Friends and Family: More than three times a week  . Frequency of Social Gatherings with Friends and Family: More than three times  a week  . Attends Religious Services: Never  . Active Member of Clubs or Organizations: No  . Attends Archivist Meetings: Never  . Marital Status: Divorced    Tobacco Counseling Counseling given: Not Answered   Clinical Intake:  Pre-visit preparation completed: Yes  Pain : 0-10 Pain Score: 4  Pain Type: Chronic pain Pain Location: Back Pain Orientation: Lower Pain Descriptors / Indicators: Dull Pain Onset: More than a month ago Pain Frequency: Intermittent Pain Relieving Factors: Sitting  Pain Relieving Factors: Sitting  Nutritional Risks: None Diabetes: No  How often do you need to have someone help you when you read instructions, pamphlets, or other written materials from your doctor or pharmacy?: 1 - Never  Diabetic?No   Interpreter Needed?: No  Information entered by :: [place] of Daily Living In your present state of health, do you have any difficulty performing the following activities: 04/17/2020 09/07/2019  Hearing? N N  Vision? N N  Difficulty concentrating or making decisions? N N  Walking or climbing stairs? N N  Dressing or bathing? N N  Doing errands, shopping? N N  Preparing Food and eating ? N -  Using the Toilet? N -  In the past six months, have you accidently leaked urine? Y -  Comment wears incontience pads -  Do you have problems with loss of bowel control? N -  Managing your Medications? N -  Managing your Finances? N -  Housekeeping or managing your Housekeeping? N -  Some recent data might be hidden    Patient Care Team: [place], Betty G, MD as PCP - General (Family Medicine) Katy Apo, MD as Consulting Physician (Ophthalmology)  Indicate any recent Medical Services you may have received from other than [facility] providers in the past year (date may be approximate).     Assessment:   This is a routine wellness examination for [name].  Hearing/Vision screen  Hearing Screening   125Hz  250Hz  500Hz  1000Hz   2000Hz  3000Hz  4000Hz  6000Hz  8000Hz   Right ear:           Left ear:           Vision Screening Comments: Patient gets annual eye exams. Is scheduled for an upcoming cataract surgery   Dietary issues and exercise activities discussed: Current Exercise Habits: The patient does not participate in regular exercise at present  Goals    . Patient Stated     I would like to be more active!     . Weight (lb) < 165 lb (74.8 kg)      Depression Screen PHQ 2/9 Scores 04/17/2020  PHQ - 2 Score 0    Fall Risk Fall Risk  04/17/2020 09/07/2019 08/30/2019 09/02/2018  Falls in the past year? 0 0 0 0  Comment - - [place] Telephone Survey: data to providers prior to load [employer] Telephone Survey: data to providers prior to load  Number falls in past yr: 0 0 - -  Injury  with Fall? 0 0 - -  Risk for fall due to : No Fall Risks - - -  Follow up Falls evaluation completed;Falls prevention discussed Education provided - -    FALL RISK PREVENTION PERTAINING TO THE HOME:  Any stairs in or around the home? Yes  If so, are there any without handrails? No  Home free of loose throw rugs in walkways, pet beds, electrical cords, etc? Yes  Adequate lighting in your home to reduce risk of falls? Yes   ASSISTIVE DEVICES UTILIZED TO PREVENT FALLS:  Life alert? No  Use of a cane, walker or w/c? No  Grab bars in the bathroom? No  Shower chair or bench in shower? Yes  Elevated toilet seat or a handicapped toilet? No   Cognitive Function:     Normal cognitive status assessed by direct observation by this Nurse Health Advisor. No abnormalities found.      Immunizations Immunization History  Administered Date(s) Administered  . Fluad Quad(high Dose 65+) 10/20/2019  . Influenza Split 12/01/2006  . Influenza-Unspecified 11/03/2013  . PFIZER(Purple Top)SARS-COV-2 Vaccination 03/14/2019, 04/08/2019, 12/14/2019  . Pneumococcal Conjugate-13 12/04/2013  . Pneumococcal Polysaccharide-23 11/30/2008, 03/02/2020  . Tdap  04/30/2009, 02/04/2019  . Zoster 02/04/2008, 10/28/2009  . Zoster Recombinat (Shingrix) 03/10/2018    TDAP status: Up to date  Flu Vaccine status: Up to date  Pneumococcal vaccine status: Up to date  Covid-19 vaccine status: Completed vaccines  Qualifies for Shingles Vaccine? Yes   Zostavax completed Yes   Shingrix Completed?: Yes  Screening Tests Health Maintenance  Topic Date Due  . COLONOSCOPY (Pts 45-40yrs Insurance coverage will need to be confirmed)  12/12/2025  . TETANUS/TDAP  02/03/2029  . INFLUENZA VACCINE  Completed  . DEXA SCAN  Completed  . COVID-19 Vaccine  Completed  . Hepatitis C Screening  Completed  . PNA vac Low Risk Adult  Completed  . HPV VACCINES  Aged Out    Health Maintenance  There are no preventive care reminders to display for this patient.  Colorectal cancer screening: Type of screening: Colonoscopy. Completed 12/13/2015. Repeat every 10 years  Mammogram status: Completed 09/13/2019. Repeat every year  Bone Density status: Completed 09/13/2019. Results reflect: Bone density results: OSTEOPENIA. Repeat every 2 years.  Lung Cancer Screening: (Low Dose CT Chest recommended if Age 65-80 years, 30 pack-year currently smoking OR have quit w/in 15years.) does not qualify.   Lung Cancer Screening Referral: N/A   Additional Screening:  Hepatitis C Screening: does qualify; Completed 10/13/2011  Vision Screening: Recommended annual ophthalmology exams for early detection of glaucoma and other disorders of the eye. Is the patient up to date with their annual eye exam?  Yes  Who is the provider or what is the name of the office in which the patient attends annual eye exams? Dr. Katy Apo  If pt is not established with a provider, would they like to be referred to a provider to establish care? No .   Dental Screening: Recommended annual dental exams for proper oral hygiene  Community Resource Referral / Chronic Care Management: CRR required this  visit?  No   CCM required this visit?  No      Plan:     I have personally reviewed and noted the following in the patient's chart:   . Medical and social history . Use of alcohol, tobacco or illicit drugs  . Current medications and supplements . Functional ability and status . Nutritional status . Physical activity . Advanced directives .  List of other physicians . Hospitalizations, surgeries, and ER visits in previous 12 months . Vitals . Screenings to include cognitive, depression, and falls . Referrals and appointments  In addition, I have reviewed and discussed with patient certain preventive protocols, quality metrics, and best practice recommendations. A written personalized care plan for preventive services as well as general preventive health recommendations were provided to patient.     Ofilia Neas, LPN   [id number]   Nurse Notes: None

## 2020-04-17 ENCOUNTER — Ambulatory Visit (INDEPENDENT_AMBULATORY_CARE_PROVIDER_SITE_OTHER): Payer: PPO

## 2020-04-17 DIAGNOSIS — Z0000 Encounter for general adult medical examination without abnormal findings: Secondary | ICD-10-CM

## 2020-04-17 NOTE — Patient Instructions (Signed)
Karen Mcclain , Thank you for taking time to come for your Medicare Wellness Visit. I appreciate your ongoing commitment to your health goals. Please review the following plan we discussed and let me know if I can assist you in the future.   Screening recommendations/referrals: Colonoscopy: Up to date, next due 12/12/2025 Mammogram: Up to date, next due 09/12/2020 Bone Density: Up to date, next due 09/12/2021 Recommended yearly ophthalmology/optometry visit for glaucoma screening and checkup Recommended yearly dental visit for hygiene and checkup  Vaccinations: Influenza vaccine: Up to date, next due fall 2022  Pneumococcal vaccine: Completed series  Tdap vaccine: Up to date, next due 02/03/2029 Shingles vaccine: Completed series     Advanced directives: Please bring copies of your advanced medical directives so that we may scan them into your chart.   Conditions/risks identified: None   Next appointment: None    Preventive Care 65 Years and Older, Female Preventive care refers to lifestyle choices and visits with your health care provider that can promote health and wellness. What does preventive care include?  A yearly physical exam. This is also called an annual well check.  Dental exams once or twice a year.  Routine eye exams. Ask your health care provider how often you should have your eyes checked.  Personal lifestyle choices, including:  Daily care of your teeth and gums.  Regular physical activity.  Eating a healthy diet.  Avoiding tobacco and drug use.  Limiting alcohol use.  Practicing safe sex.  Taking low-dose aspirin every day.  Taking vitamin and mineral supplements as recommended by your health care provider. What happens during an annual well check? The services and screenings done by your health care provider during your annual well check will depend on your age, overall health, lifestyle risk factors, and family history of disease. Counseling   Your health care provider may ask you questions about your:  Alcohol use.  Tobacco use.  Drug use.  Emotional well-being.  Home and relationship well-being.  Sexual activity.  Eating habits.  History of falls.  Memory and ability to understand (cognition).  Work and work Statistician.  Reproductive health. Screening  You may have the following tests or measurements:  Height, weight, and BMI.  Blood pressure.  Lipid and cholesterol levels. These may be checked every 5 years, or more frequently if you are over 22 years old.  Skin check.  Lung cancer screening. You may have this screening every year starting at age 60 if you have a 30-pack-year history of smoking and currently smoke or have quit within the past 15 years.  Fecal occult blood test (FOBT) of the stool. You may have this test every year starting at age 30.  Flexible sigmoidoscopy or colonoscopy. You may have a sigmoidoscopy every 5 years or a colonoscopy every 10 years starting at age 59.  Hepatitis C blood test.  Hepatitis B blood test.  Sexually transmitted disease (STD) testing.  Diabetes screening. This is done by checking your blood sugar (glucose) after you have not eaten for a while (fasting). You may have this done every 1-3 years.  Bone density scan. This is done to screen for osteoporosis. You may have this done starting at age 98.  Mammogram. This may be done every 1-2 years. Talk to your health care provider about how often you should have regular mammograms. Talk with your health care provider about your test results, treatment options, and if necessary, the need for more tests. Vaccines  Your health care  provider may recommend certain vaccines, such as:  Influenza vaccine. This is recommended every year.  Tetanus, diphtheria, and acellular pertussis (Tdap, Td) vaccine. You may need a Td booster every 10 years.  Zoster vaccine. You may need this after age 47.  Pneumococcal 13-valent  conjugate (PCV13) vaccine. One dose is recommended after age 31.  Pneumococcal polysaccharide (PPSV23) vaccine. One dose is recommended after age 50. Talk to your health care provider about which screenings and vaccines you need and how often you need them. This information is not intended to replace advice given to you by your health care provider. Make sure you discuss any questions you have with your health care provider. Document Released: 02/16/2015 Document Revised: 10/10/2015 Document Reviewed: 11/21/2014 Elsevier Interactive Patient Education  2017 [place] Prevention in the Home Falls can cause injuries. They can happen to people of all ages. There are many things you can do to make your home safe and to help prevent falls. What can I do on the outside of my home?  Regularly fix the edges of walkways and driveways and fix any cracks.  Remove anything that might make you trip as you walk through a door, such as a raised step or threshold.  Trim any bushes or trees on the path to your home.  Use bright outdoor lighting.  Clear any walking paths of anything that might make someone trip, such as rocks or tools.  Regularly check to see if handrails are loose or broken. Make sure that both sides of any steps have handrails.  Any raised decks and porches should have guardrails on the edges.  Have any leaves, snow, or ice cleared regularly.  Use sand or salt on walking paths during winter.  Clean up any spills in your garage right away. This includes oil or grease spills. What can I do in the bathroom?  Use night lights.  Install grab bars by the toilet and in the tub and shower. Do not use towel bars as grab bars.  Use non-skid mats or decals in the tub or shower.  If you need to sit down in the shower, use a plastic, non-slip stool.  Keep the floor dry. Clean up any water that spills on the floor as soon as it happens.  Remove soap buildup in the tub or  shower regularly.  Attach bath mats securely with double-sided non-slip rug tape.  Do not have throw rugs and other things on the floor that can make you trip. What can I do in the bedroom?  Use night lights.  Make sure that you have a light by your bed that is easy to reach.  Do not use any sheets or blankets that are too big for your bed. They should not hang down onto the floor.  Have a firm chair that has side arms. You can use this for support while you get dressed.  Do not have throw rugs and other things on the floor that can make you trip. What can I do in the kitchen?  Clean up any spills right away.  Avoid walking on wet floors.  Keep items that you use a lot in easy-to-reach places.  If you need to reach something above you, use a strong step stool that has a grab bar.  Keep electrical cords out of the way.  Do not use floor polish or wax that makes floors slippery. If you must use wax, use non-skid floor wax.  Do not have throw  rugs and other things on the floor that can make you trip. What can I do with my stairs?  Do not leave any items on the stairs.  Make sure that there are handrails on both sides of the stairs and use them. Fix handrails that are broken or loose. Make sure that handrails are as long as the stairways.  Check any carpeting to make sure that it is firmly attached to the stairs. Fix any carpet that is loose or worn.  Avoid having throw rugs at the top or bottom of the stairs. If you do have throw rugs, attach them to the floor with carpet tape.  Make sure that you have a light switch at the top of the stairs and the bottom of the stairs. If you do not have them, ask someone to add them for you. What else can I do to help prevent falls?  Wear shoes that:  Do not have high heels.  Have rubber bottoms.  Are comfortable and fit you well.  Are closed at the toe. Do not wear sandals.  If you use a stepladder:  Make sure that it is fully  opened. Do not climb a closed stepladder.  Make sure that both sides of the stepladder are locked into place.  Ask someone to hold it for you, if possible.  Clearly mark and make sure that you can see:  Any grab bars or handrails.  First and last steps.  Where the edge of each step is.  Use tools that help you move around (mobility aids) if they are needed. These include:  Canes.  Walkers.  Scooters.  Crutches.  Turn on the lights when you go into a dark area. Replace any light bulbs as soon as they burn out.  Set up your furniture so you have a clear path. Avoid moving your furniture around.  If any of your floors are uneven, fix them.  If there are any pets around you, be aware of where they are.  Review your medicines with your doctor. Some medicines can make you feel dizzy. This can increase your chance of falling. Ask your doctor what other things that you can do to help prevent falls. This information is not intended to replace advice given to you by your health care provider. Make sure you discuss any questions you have with your health care provider. Document Released: 11/16/2008 Document Revised: 06/28/2015 Document Reviewed: 02/24/2014 Elsevier Interactive Patient Education  2017 [employer].

## 2020-04-25 DIAGNOSIS — D2272 Melanocytic nevi of left lower limb, including hip: Secondary | ICD-10-CM | POA: Diagnosis not present

## 2020-04-25 DIAGNOSIS — D225 Melanocytic nevi of trunk: Secondary | ICD-10-CM | POA: Diagnosis not present

## 2020-04-25 DIAGNOSIS — D2371 Other benign neoplasm of skin of right lower limb, including hip: Secondary | ICD-10-CM | POA: Diagnosis not present

## 2020-04-25 DIAGNOSIS — Z85820 Personal history of malignant melanoma of skin: Secondary | ICD-10-CM | POA: Diagnosis not present

## 2020-04-25 DIAGNOSIS — D2271 Melanocytic nevi of right lower limb, including hip: Secondary | ICD-10-CM | POA: Diagnosis not present

## 2020-04-25 DIAGNOSIS — L814 Other melanin hyperpigmentation: Secondary | ICD-10-CM | POA: Diagnosis not present

## 2020-04-25 DIAGNOSIS — L820 Inflamed seborrheic keratosis: Secondary | ICD-10-CM | POA: Diagnosis not present

## 2020-04-25 DIAGNOSIS — L821 Other seborrheic keratosis: Secondary | ICD-10-CM | POA: Diagnosis not present

## 2020-04-25 DIAGNOSIS — L570 Actinic keratosis: Secondary | ICD-10-CM | POA: Diagnosis not present

## 2020-05-01 DIAGNOSIS — H2512 Age-related nuclear cataract, left eye: Secondary | ICD-10-CM | POA: Diagnosis not present

## 2020-05-01 DIAGNOSIS — H25812 Combined forms of age-related cataract, left eye: Secondary | ICD-10-CM | POA: Diagnosis not present

## 2020-05-01 DIAGNOSIS — H21562 Pupillary abnormality, left eye: Secondary | ICD-10-CM | POA: Diagnosis not present

## 2020-05-10 ENCOUNTER — Telehealth: Payer: Self-pay | Admitting: Family Medicine

## 2020-05-10 DIAGNOSIS — I10 Essential (primary) hypertension: Secondary | ICD-10-CM

## 2020-05-10 NOTE — Telephone Encounter (Signed)
Patient is calling and requesting a refill for losartan (COZAAR) 50 MG tablet to be sent to  [facility] [address], [place] [address]  Phone:  [phone] Fax:  [phone] CB is [phone]

## 2020-05-11 MED ORDER — LOSARTAN POTASSIUM 50 MG PO TABS: 50.0000 mg | ORAL_TABLET | Freq: Every day | ORAL | 2 refills | Status: DC

## 2020-05-11 NOTE — Telephone Encounter (Signed)
Rx sent in

## 2020-05-15 ENCOUNTER — Ambulatory Visit (INDEPENDENT_AMBULATORY_CARE_PROVIDER_SITE_OTHER): Payer: PPO | Admitting: Family Medicine

## 2020-05-15 ENCOUNTER — Encounter: Payer: Self-pay | Admitting: Family Medicine

## 2020-05-15 ENCOUNTER — Other Ambulatory Visit: Payer: Self-pay

## 2020-05-15 VITALS — BP 126/80 | HR 82 | Resp 16 | Ht 66.0 in | Wt 169.4 lb

## 2020-05-15 DIAGNOSIS — I10 Essential (primary) hypertension: Secondary | ICD-10-CM | POA: Diagnosis not present

## 2020-05-15 DIAGNOSIS — N951 Menopausal and female climacteric states: Secondary | ICD-10-CM | POA: Diagnosis not present

## 2020-05-15 DIAGNOSIS — K219 Gastro-esophageal reflux disease without esophagitis: Secondary | ICD-10-CM | POA: Diagnosis not present

## 2020-05-15 DIAGNOSIS — G4700 Insomnia, unspecified: Secondary | ICD-10-CM | POA: Diagnosis not present

## 2020-05-15 MED ORDER — PAROXETINE HCL 10 MG PO TABS: 10.0000 mg | ORAL_TABLET | Freq: Every day | ORAL | 2 refills | Status: DC

## 2020-05-15 MED ORDER — DEXLANSOPRAZOLE 30 MG PO CPDR: 30.0000 mg | DELAYED_RELEASE_CAPSULE | Freq: Every day | ORAL | 1 refills | Status: DC

## 2020-05-15 NOTE — Progress Notes (Signed)
Chief Complaint  Patient presents with  . medication follow up    Discuss going back on dexilant for heartburn. OTC products such as omeprazole, pantoprazole, pepcid & tums aren't helping.  Marland Kitchen Hot Flashes    Ongoing - has done hormonal replacement therapy & taken fosamax, neither has helped.    HPI: Karen Mcclain is a pleasant 75 y.o. female with history of osteoporosis, vitamin D deficiency, hyperlipidemia, and hypertension here today with above concerns. For many years she has had hot flashes, day and night, interfering with sleep. She has tried hormonal therapy in the past, did not help much. She has not identified exacerbating or alleviating factors. She would like to explore nonhormonal treatment and wonders if OTC supplements may help.  Sleeps about 6 hours, wakes up 4 hours after falling asleep and 2 more hours after going back to sleep. + Fatigue. She is still working, not as many hours as she did before.  GERD: Acid reflux and heartburn. In the past she has been on different PPI medications, which were not very effective. Tums helped temporarily. No associated abdominal pain, nausea, vomiting, melena, or blood in the stool. Years ago she was on Dexilant, which was the only medication that really help. Problem is exacerbated by tomatoes.  Last follow up on 09/07/2019. Hypertension: Currently she is on losartan 50 mg daily. She denies checking BP at home. Negative for severe/frequent headache, visual changes, chest pain, dyspnea, palpitation, focal weakness, or edema.  Lab Results  Component Value Date   CREATININE 0.78 09/15/2019   BUN 17 09/15/2019   NA 138 09/15/2019   K 4.5 09/15/2019   CL 102 09/15/2019   CO2 29 09/15/2019   Review of Systems  Constitutional: Negative for activity change, appetite change and fever.  HENT: Negative for mouth sores, nosebleeds and sore throat.   Eyes: Negative for visual disturbance.  Respiratory: Negative for cough and  wheezing.   Gastrointestinal:       Negative for changes in bowel habits.  Endocrine: Negative for cold intolerance and heat intolerance.  Genitourinary: Negative for decreased urine volume, dysuria and hematuria.  Neurological: Negative for syncope, facial asymmetry and weakness.  Rest see pertinent positives and negatives per HPI.  Current Outpatient Medications on File Prior to Visit  Medication Sig Dispense Refill  . losartan (COZAAR) 50 MG tablet Take 1 tablet (50 mg total) by mouth daily. 90 tablet 2  . Multiple Vitamin (MULTIVITAMIN) tablet Take 1 tablet by mouth.    . Multiple Vitamins-Minerals (VITAMIN D3 COMPLETE PO) Take 5,000 Units by mouth daily.     No current facility-administered medications on file prior to visit.   Past Medical History:  Diagnosis Date  . Cataract 11/2018  . GERD (gastroesophageal reflux disease)   . Hyperlipidemia 09/07/2019  . Hypertension 03/2015  . Osteoporosis 09/07/2019   No Known Allergies  Social History   Socioeconomic History  . Marital status: Divorced    Spouse name: Not on file  . Number of children: Not on file  . Years of education: Not on file  . Highest education level: Not on file  Occupational History  . Not on file  Tobacco Use  . Smoking status: Never Smoker  . Smokeless tobacco: Never Used  Substance and Sexual Activity  . Alcohol use: Yes    Alcohol/week: 0.0 standard drinks    Comment: on weekends - 2 glasses wine.  . Drug use: No  . Sexual activity: Not on file  Other  Topics Concern  . Not on file  Social History Narrative  . Not on file   Social Determinants of Health   Financial Resource Strain: Low Risk   . Difficulty of Paying Living Expenses: Not hard at all  Food Insecurity: No Food Insecurity  . Worried About Charity fundraiser in the Last Year: Never true  . Ran Out of Food in the Last Year: Never true  Transportation Needs: No Transportation Needs  . Lack of Transportation (Medical): No  . Lack  of Transportation (Non-Medical): No  Physical Activity: Inactive  . Days of Exercise per Week: 0 days  . Minutes of Exercise per Session: 0 min  Stress: No Stress Concern Present  . Feeling of Stress : Not at all  Social Connections: Socially Isolated  . Frequency of Communication with Friends and Family: More than three times a week  . Frequency of Social Gatherings with Friends and Family: More than three times a week  . Attends Religious Services: Never  . Active Member of Clubs or Organizations: No  . Attends Archivist Meetings: Never  . Marital Status: Divorced   Vitals:   05/15/20 1548  BP: 126/80  Pulse: 82  Resp: 16  SpO2: 98%   Body mass index is 27.34 kg/m.  Physical Exam Vitals and nursing note reviewed.  Constitutional:      General: She is not in acute distress.    Appearance: She is well-developed.  HENT:     Head: Normocephalic and atraumatic.     Mouth/Throat:     Mouth: Mucous membranes are moist.  Eyes:     Conjunctiva/sclera: Conjunctivae normal.     Pupils: Pupils are unequal (left one mildly bigger, s/p cataract surgery).     Right eye: Pupil is round and reactive.     Left eye: Pupil is round and reactive.  Cardiovascular:     Rate and Rhythm: Normal rate and regular rhythm.     Pulses:          Dorsalis pedis pulses are 2+ on the right side and 2+ on the left side.     Heart sounds: No murmur heard.   Pulmonary:     Effort: Pulmonary effort is normal. No respiratory distress.     Breath sounds: Normal breath sounds.  Abdominal:     Palpations: Abdomen is soft. There is no hepatomegaly or mass.     Tenderness: There is no abdominal tenderness.  Lymphadenopathy:     Cervical: No cervical adenopathy.  Skin:    General: Skin is warm.     Findings: No erythema or rash.  Neurological:     Mental Status: She is alert and oriented to person, place, and time.     Cranial Nerves: No cranial nerve deficit.     Gait: Gait normal.   Psychiatric:     Comments: Well groomed, good eye contact.   ASSESSMENT AND PLAN:  Karen Mcclain was seen today for medication follow up and hot flashes.  Diagnoses and all orders for this visit: Orders Placed This Encounter  Procedures  . TSH  . Basic metabolic panel   Lab Results  Component Value Date   TSH 1.88 05/15/2020   Lab Results  Component Value Date   CREATININE 0.86 05/15/2020   BUN 17 05/15/2020   NA 139 05/15/2020   K 4.4 05/15/2020   CL 101 05/15/2020   CO2 29 05/15/2020   Insomnia, unspecified type Managing hot flashes may help. Good  sleep hygiene.  Hypertension, essential, benign BP adequately controlled. Continue losartan 50 mg daily. Monitor BP at home. Continue low-salt diet.  GERD (gastroesophageal reflux disease) She has tried different PPIs. Resume Dexilant 30 mg daily, she can take it for 4 to 6 weeks and then as needed. We discussed some side effects of chronic use of PPIs. If problem is persistent EGD may need to be considered.  Hot flashes, menopausal We discussed nonhormonal pharmacologic options. She would like to try paroxetine, recommend starting 5 mg daily at bedtime and increase dose to 10 mg in a week if well-tolerated. We discussed some side effects. Instructed to let me know in about 8 weeks if medication is helping.   Spent 40 minutes.  During this time history was obtained and documented, examination was performed, prior labs reviewed, and assessment/plan discussed.  Return in about 6 months (around 11/14/2020) for HTN,HLD.    G. Martinique, MD  [facility]. [place] office.    A few things to remember from today's visit:   Hot flashes - Plan: TSH, PARoxetine (PAXIL) 10 MG tablet  Gastroesophageal reflux disease, unspecified whether esophagitis present - Plan: Dexlansoprazole (DEXILANT) 30 MG capsule  Hypertension, essential, benign - Plan: Basic metabolic panel  If you need refills please call your  pharmacy. Do not use My Chart to request refills or for acute issues that need immediate attention.   Paroxetine 10 mg started today for hot flashes. Start 1/2 tab and increase to 1 tab in a week if well tolerated.  Dexilant 30 mg daily for 4-6 weeks then prn . In about 8 weeks let us know if medications are helping.  Please be sure medication list is accurate. If a new problem present, please set up appointment sooner than planned today.

## 2020-05-15 NOTE — Assessment & Plan Note (Signed)
We discussed nonhormonal pharmacologic options. She would like to try paroxetine, recommend starting 5 mg daily at bedtime and increase dose to 10 mg in a week if well-tolerated. We discussed some side effects. Instructed to let me know in about 8 weeks if medication is helping.

## 2020-05-15 NOTE — Patient Instructions (Addendum)
A few things to remember from today's visit:   Hot flashes - Plan: TSH, PARoxetine (PAXIL) 10 MG tablet  Gastroesophageal reflux disease, unspecified whether esophagitis present - Plan: Dexlansoprazole (DEXILANT) 30 MG capsule  Hypertension, essential, benign - Plan: Basic metabolic panel  If you need refills please call your pharmacy. Do not use My Chart to request refills or for acute issues that need immediate attention.   Paroxetine 10 mg started today for hot flashes. Start 1/2 tab and increase to 1 tab in a week if well tolerated.  Dexilant 30 mg daily for 4-6 weeks then prn . In about 8 weeks let us know if medications are helping.  Please be sure medication list is accurate. If a new problem present, please set up appointment sooner than planned today.

## 2020-05-15 NOTE — Assessment & Plan Note (Signed)
BP adequately controlled. Continue losartan 50 mg daily. Monitor BP at home. Continue low-salt diet.

## 2020-05-15 NOTE — Assessment & Plan Note (Addendum)
She has tried different PPIs. Resume Dexilant 30 mg daily, she can take it for 4 to 6 weeks and then as needed. We discussed some side effects of chronic use of PPIs. If problem is persistent EGD may need to be considered.

## 2020-05-16 LAB — BASIC METABOLIC PANEL
BUN: 17 mg/dL (ref 6–23)
CO2: 29 mEq/L (ref 19–32)
Calcium: 10.1 mg/dL (ref 8.4–10.5)
Chloride: 101 mEq/L (ref 96–112)
Creatinine, Ser: 0.86 mg/dL (ref 0.40–1.20)
GFR: 65.89 mL/min (ref >60.00)
Glucose, Bld: 86 mg/dL (ref 70–99)
Potassium: 4.4 mEq/L (ref 3.5–5.1)
Sodium: 139 mEq/L (ref 135–145)

## 2020-05-16 LAB — TSH: TSH: 1.88 u[IU]/mL (ref 0.35–4.50)

## 2020-05-23 DIAGNOSIS — L820 Inflamed seborrheic keratosis: Secondary | ICD-10-CM | POA: Diagnosis not present

## 2020-05-23 DIAGNOSIS — D485 Neoplasm of uncertain behavior of skin: Secondary | ICD-10-CM | POA: Diagnosis not present

## 2020-06-13 ENCOUNTER — Ambulatory Visit: Payer: PPO | Attending: Internal Medicine

## 2020-06-13 DIAGNOSIS — Z23 Encounter for immunization: Secondary | ICD-10-CM

## 2020-06-13 NOTE — Progress Notes (Signed)
   Covid-19 Vaccination Clinic  Name:  Karen Mcclain    MRN: [id number] DOB: 03-19-1945  06/13/2020  [name] was observed post Covid-19 immunization for 15 minutes without incident. She was provided with Vaccine Information Sheet and instruction to access the V-Safe system.   [name] was instructed to call 911 with any severe reactions post vaccine: Marland Kitchen Difficulty breathing  . Swelling of face and throat  . A fast heartbeat  . A bad rash all over body  . Dizziness and weakness   Immunizations Administered    Name Date Dose VIS Date Route   PFIZER Comrnaty(Gray TOP) Covid-19 Vaccine 06/13/2020 11:22 AM 0.3 mL 01/12/2020 Intramuscular   Manufacturer: [employer], [employer]   Lot: [id number]   [facility]: [phone]

## 2020-06-18 ENCOUNTER — Other Ambulatory Visit (HOSPITAL_COMMUNITY): Payer: Self-pay

## 2020-06-18 MED ORDER — COVID-19 MRNA VAC-TRIS(PFIZER) 30 MCG/0.3ML IM SUSP
INTRAMUSCULAR | 0 refills | Status: DC
  Filled 2020-06-18: qty 0.3, 17d supply, fill #0

## 2020-09-13 DIAGNOSIS — Z961 Presence of intraocular lens: Secondary | ICD-10-CM | POA: Diagnosis not present

## 2020-10-25 ENCOUNTER — Ambulatory Visit (INDEPENDENT_AMBULATORY_CARE_PROVIDER_SITE_OTHER): Payer: PPO

## 2020-10-25 ENCOUNTER — Other Ambulatory Visit: Payer: Self-pay

## 2020-10-25 DIAGNOSIS — Z23 Encounter for immunization: Secondary | ICD-10-CM

## 2020-11-16 ENCOUNTER — Encounter: Payer: Self-pay | Admitting: Family Medicine

## 2020-11-19 ENCOUNTER — Encounter: Payer: Self-pay | Admitting: Family Medicine

## 2020-12-06 ENCOUNTER — Other Ambulatory Visit (HOSPITAL_BASED_OUTPATIENT_CLINIC_OR_DEPARTMENT_OTHER): Payer: Self-pay

## 2020-12-06 ENCOUNTER — Ambulatory Visit: Payer: PPO | Attending: Internal Medicine

## 2020-12-06 DIAGNOSIS — Z23 Encounter for immunization: Secondary | ICD-10-CM

## 2020-12-06 MED ORDER — PFIZER COVID-19 VAC BIVALENT 30 MCG/0.3ML IM SUSP
INTRAMUSCULAR | 0 refills | Status: DC
  Filled 2020-12-06: qty 0.3, 1d supply, fill #0

## 2020-12-06 NOTE — Progress Notes (Signed)
   Covid-19 Vaccination Clinic  Name:  Karen Mcclain    MRN: [id number] DOB: [birth year]  12/06/2020  [name] was observed post Covid-19 immunization for 15 minutes without incident. She was provided with Vaccine Information Sheet and instruction to access the V-Safe system.   [name] was instructed to call 911 with any severe reactions post vaccine: Difficulty breathing  Swelling of face and throat  A fast heartbeat  A bad rash all over body  Dizziness and weakness   Immunizations Administered     Name Date Dose VIS Date Route   Pfizer Covid-19 Vaccine Bivalent Booster 12/06/2020  2:54 PM 0.3 mL 10/03/2020 Intramuscular   Manufacturer: [place]   Lot: [id number]   [place]: [phone]

## 2021-01-09 DIAGNOSIS — Z85820 Personal history of malignant melanoma of skin: Secondary | ICD-10-CM | POA: Diagnosis not present

## 2021-01-09 DIAGNOSIS — D485 Neoplasm of uncertain behavior of skin: Secondary | ICD-10-CM | POA: Diagnosis not present

## 2021-01-09 DIAGNOSIS — L814 Other melanin hyperpigmentation: Secondary | ICD-10-CM | POA: Diagnosis not present

## 2021-01-09 DIAGNOSIS — L821 Other seborrheic keratosis: Secondary | ICD-10-CM | POA: Diagnosis not present

## 2021-01-09 DIAGNOSIS — D225 Melanocytic nevi of trunk: Secondary | ICD-10-CM | POA: Diagnosis not present

## 2021-01-09 DIAGNOSIS — L918 Other hypertrophic disorders of the skin: Secondary | ICD-10-CM | POA: Diagnosis not present

## 2021-01-09 DIAGNOSIS — L905 Scar conditions and fibrosis of skin: Secondary | ICD-10-CM | POA: Diagnosis not present

## 2021-01-09 DIAGNOSIS — D2272 Melanocytic nevi of left lower limb, including hip: Secondary | ICD-10-CM | POA: Diagnosis not present

## 2021-01-09 DIAGNOSIS — L820 Inflamed seborrheic keratosis: Secondary | ICD-10-CM | POA: Diagnosis not present

## 2021-01-09 DIAGNOSIS — D1801 Hemangioma of skin and subcutaneous tissue: Secondary | ICD-10-CM | POA: Diagnosis not present

## 2021-02-06 ENCOUNTER — Encounter: Payer: Self-pay | Admitting: Family Medicine

## 2021-02-06 ENCOUNTER — Other Ambulatory Visit (HOSPITAL_COMMUNITY): Payer: Self-pay

## 2021-02-06 DIAGNOSIS — K219 Gastro-esophageal reflux disease without esophagitis: Secondary | ICD-10-CM

## 2021-02-06 DIAGNOSIS — I10 Essential (primary) hypertension: Secondary | ICD-10-CM

## 2021-02-06 MED ORDER — VALACYCLOVIR HCL 1 G PO TABS
2000.0000 mg | ORAL_TABLET | ORAL | 0 refills | Status: DC
  Filled 2021-05-14: qty 12, 3d supply, fill #0

## 2021-02-06 MED ORDER — DEXLANSOPRAZOLE 30 MG PO CPDR
30.0000 mg | DELAYED_RELEASE_CAPSULE | Freq: Every day | ORAL | 2 refills | Status: DC
  Filled 2021-02-06 (×2): qty 90, 90d supply, fill #0

## 2021-02-06 MED ORDER — LOSARTAN POTASSIUM 50 MG PO TABS
50.0000 mg | ORAL_TABLET | Freq: Every day | ORAL | 2 refills | Status: DC
  Filled 2021-02-06: qty 90, 90d supply, fill #0

## 2021-02-07 ENCOUNTER — Other Ambulatory Visit (HOSPITAL_COMMUNITY): Payer: Self-pay

## 2021-02-07 ENCOUNTER — Encounter: Payer: Self-pay | Admitting: Family Medicine

## 2021-02-07 DIAGNOSIS — L988 Other specified disorders of the skin and subcutaneous tissue: Secondary | ICD-10-CM | POA: Diagnosis not present

## 2021-02-07 DIAGNOSIS — D485 Neoplasm of uncertain behavior of skin: Secondary | ICD-10-CM | POA: Diagnosis not present

## 2021-02-14 ENCOUNTER — Other Ambulatory Visit: Payer: Self-pay | Admitting: Family Medicine

## 2021-02-14 DIAGNOSIS — I10 Essential (primary) hypertension: Secondary | ICD-10-CM

## 2021-02-28 ENCOUNTER — Other Ambulatory Visit (HOSPITAL_COMMUNITY): Payer: Self-pay

## 2021-02-28 ENCOUNTER — Ambulatory Visit (INDEPENDENT_AMBULATORY_CARE_PROVIDER_SITE_OTHER): Payer: PPO | Admitting: Adult Health

## 2021-02-28 ENCOUNTER — Encounter: Payer: Self-pay | Admitting: Adult Health

## 2021-02-28 VITALS — BP 120/60 | HR 90 | Temp 98.2°F | Wt 161.6 lb

## 2021-02-28 DIAGNOSIS — N3001 Acute cystitis with hematuria: Secondary | ICD-10-CM | POA: Diagnosis not present

## 2021-02-28 LAB — POCT URINALYSIS DIPSTICK
Bilirubin, UA: POSITIVE
Blood, UA: POSITIVE
Glucose, UA: NEGATIVE
Ketones, UA: NEGATIVE
Nitrite, UA: NEGATIVE
Protein, UA: POSITIVE — AB
Spec Grav, UA: >=1.03 — AB (ref 1.010–1.025)
Urobilinogen, UA: 0.2 E.U./dL
pH, UA: 6 (ref 5.0–8.0)

## 2021-02-28 MED ORDER — AMOXICILLIN-POT CLAVULANATE 875-125 MG PO TABS
1.0000 | ORAL_TABLET | Freq: Two times a day (BID) | ORAL | 0 refills | Status: AC
  Filled 2021-02-28: qty 10, 5d supply, fill #0

## 2021-02-28 MED ORDER — FLUCONAZOLE 150 MG PO TABS
150.0000 mg | ORAL_TABLET | Freq: Once | ORAL | 0 refills | Status: AC
  Filled 2021-02-28: qty 1, 1d supply, fill #0

## 2021-02-28 NOTE — Progress Notes (Signed)
Subjective:    Patient ID: Karen Mcclain, female    DOB: 16-Apr-1945, 76 y.o.   MRN: [id number]  HPI 76 year old female who  has a past medical history of Cataract (11/2018), GERD (gastroesophageal reflux disease), Hyperlipidemia (09/07/2019), Hypertension (03/2015), and Osteoporosis (09/07/2019).  She presents to the office today for an acute issue. She has had three days of urinary pressure, urgency, frequency, cloudy urine.  Denies worsening back pain past baseline, fevers, or chills.   Review of Systems See HPI   Past Medical History:  Diagnosis Date   Cataract 11/2018   GERD (gastroesophageal reflux disease)    Hyperlipidemia 09/07/2019   Hypertension 03/2015   Osteoporosis 09/07/2019    Social History   Socioeconomic History   Marital status: Divorced    Spouse name: Not on file   Number of children: Not on file   Years of education: Not on file   Highest education level: Not on file  Occupational History   Not on file  Tobacco Use   Smoking status: Never   Smokeless tobacco: Never  Substance and Sexual Activity   Alcohol use: Yes    Alcohol/week: 0.0 standard drinks    Comment: on weekends - 2 glasses wine.   Drug use: No   Sexual activity: Not on file  Other Topics Concern   Not on file  Social History Narrative   Not on file   Social Determinants of Health   Financial Resource Strain: Low Risk    Difficulty of Paying Living Expenses: Not hard at all  Food Insecurity: No Food Insecurity   Worried About Running Out of Food in the Last Year: Never true   [place] in the Last Year: Never true  Transportation Needs: No Transportation Needs   Lack of Transportation (Medical): No   Lack of Transportation (Non-Medical): No  Physical Activity: Inactive   Days of Exercise per Week: 0 days   Minutes of Exercise per Session: 0 min  Stress: No Stress Concern Present   Feeling of Stress : Not at all  Social Connections: Socially Isolated   Frequency of  Communication with Friends and Family: More than three times a week   Frequency of Social Gatherings with Friends and Family: More than three times a week   Attends Religious Services: Never   Marine scientist or Organizations: No   Attends Music therapist: Never   Marital Status: Divorced  Human resources officer Violence: Not At Risk   Fear of Current or Ex-Partner: No   Emotionally Abused: No   Physically Abused: No   Sexually Abused: No    Past Surgical History:  Procedure Laterality Date   TUBAL LIGATION      Family History  Problem Relation Age of Onset   Heart disease Father    Heart disease Mother     No Known Allergies  Current Outpatient Medications on File Prior to Visit  Medication Sig Dispense Refill   COVID-19 mRNA bivalent vaccine, Pfizer, (PFIZER COVID-19 VAC BIVALENT) injection Inject into the muscle. 0.3 mL 0   COVID-19 mRNA Vac-TriS, Pfizer, SUSP injection Inject into the muscle. 0.3 mL 0   Dexlansoprazole (DEXILANT) 30 MG capsule DR Take 1 capsule (30 mg total) by mouth daily. 90 capsule 2   losartan (COZAAR) 50 MG tablet TAKE 1 TABLET(50 MG) BY MOUTH DAILY 90 tablet 2   Multiple Vitamin (MULTIVITAMIN) tablet Take 1 tablet by mouth.     Multiple Vitamins-Minerals (  VITAMIN D3 COMPLETE PO) Take 5,000 Units by mouth daily.     PARoxetine (PAXIL) 10 MG tablet Take 1 tablet (10 mg total) by mouth daily. 30 tablet 2   valACYclovir (VALTREX) 1000 MG tablet Take 2 tablets (2,000 mg total) by mouth at onset of outbreak then repeat in 12 hours 12 tablet 0   No current facility-administered medications on file prior to visit.    BP 120/60 (BP Location: Left Arm, Patient Position: Sitting, Cuff Size: Normal)    Pulse 90    Temp 98.2 F (36.8 C) (Oral)    Wt 161 lb 9.6 oz (73.3 kg)    SpO2 99%    BMI 26.08 kg/m       Objective:   Physical Exam Vitals and nursing note reviewed.  Constitutional:      Appearance: Normal appearance.  Cardiovascular:      Rate and Rhythm: Normal rate and regular rhythm.  Abdominal:     General: Abdomen is flat. Bowel sounds are normal. There is no distension.     Palpations: Abdomen is soft.     Tenderness: There is no abdominal tenderness. There is no right CVA tenderness or left CVA tenderness.  Musculoskeletal:        General: Normal range of motion.  Skin:    General: Skin is warm and dry.     Capillary Refill: Capillary refill takes less than 2 seconds.  Neurological:     General: No focal deficit present.     Mental Status: She is alert and oriented to person, place, and time.  Psychiatric:        Mood and Affect: Mood normal.        Behavior: Behavior normal.        Thought Content: Thought content normal.        Judgment: Judgment normal.      Assessment & Plan:  1. Acute cystitis with hematuria  - POCT urinalysis dipstick+ leuks and protein.  - Will send culture and treat for UTI with Augmentin due to symptoms.  - Urine Culture - amoxicillin-clavulanate (AUGMENTIN) 875-125 MG tablet; Take 1 tablet by mouth 2 (two) times daily for 5 days.  Dispense: 10 tablet; Refill: 0 - fluconazole (DIFLUCAN) 150 MG tablet; Take 1 tablet (150 mg total) by mouth once for 1 dose.  Dispense: 1 tablet; Refill: 0  Dorothyann Peng, NP

## 2021-02-28 NOTE — Patient Instructions (Signed)
It was great seeing you today   I have sent in an antibiotic called Augmentin to help with your UTI symptoms   We will follow up with you once we get your labs back

## 2021-03-02 LAB — URINE CULTURE
MICRO NUMBER:: 12924031
SPECIMEN QUALITY:: ADEQUATE

## 2021-03-14 ENCOUNTER — Other Ambulatory Visit: Payer: Self-pay | Admitting: Adult Health

## 2021-03-14 ENCOUNTER — Other Ambulatory Visit (HOSPITAL_COMMUNITY): Payer: Self-pay

## 2021-03-14 ENCOUNTER — Encounter: Payer: Self-pay | Admitting: Adult Health

## 2021-03-14 DIAGNOSIS — N3001 Acute cystitis with hematuria: Secondary | ICD-10-CM

## 2021-03-15 ENCOUNTER — Ambulatory Visit (INDEPENDENT_AMBULATORY_CARE_PROVIDER_SITE_OTHER): Payer: PPO | Admitting: Family Medicine

## 2021-03-15 ENCOUNTER — Other Ambulatory Visit (HOSPITAL_COMMUNITY): Payer: Self-pay

## 2021-03-15 ENCOUNTER — Encounter: Payer: Self-pay | Admitting: Family Medicine

## 2021-03-15 VITALS — BP 126/82 | HR 82 | Temp 97.8°F | Resp 16 | Ht 66.0 in | Wt 160.0 lb

## 2021-03-15 DIAGNOSIS — R319 Hematuria, unspecified: Secondary | ICD-10-CM | POA: Diagnosis not present

## 2021-03-15 DIAGNOSIS — R102 Pelvic and perineal pain: Secondary | ICD-10-CM

## 2021-03-15 DIAGNOSIS — R3989 Other symptoms and signs involving the genitourinary system: Secondary | ICD-10-CM

## 2021-03-15 DIAGNOSIS — R1024 Suprapubic pain: Secondary | ICD-10-CM

## 2021-03-15 LAB — POCT URINALYSIS DIPSTICK
Glucose, UA: NEGATIVE
Nitrite, UA: POSITIVE
Protein, UA: POSITIVE — AB
Spec Grav, UA: 1.015 (ref 1.010–1.025)
pH, UA: 6 (ref 5.0–8.0)

## 2021-03-15 LAB — URINALYSIS, MICROSCOPIC ONLY

## 2021-03-15 MED ORDER — SULFAMETHOXAZOLE-TRIMETHOPRIM 800-160 MG PO TABS
1.0000 | ORAL_TABLET | Freq: Two times a day (BID) | ORAL | 0 refills | Status: AC
  Filled 2021-03-15: qty 10, 5d supply, fill #0

## 2021-03-15 NOTE — Telephone Encounter (Signed)
Pt is being scheduled now. Pt aware and will come to office.

## 2021-03-15 NOTE — Patient Instructions (Signed)
A few things to remember from today's visit:  Suspected UTI - Plan: POC Urinalysis Dipstick, sulfamethoxazole-trimethoprim (BACTRIM DS) 800-160 MG tablet  Hematuria, unspecified type - Plan: Urine Microscopic Only  If you need refills please call your pharmacy. Do not use My Chart to request refills or for acute issues that need immediate attention.   Bactrim started today. Start a probiotic, Align 1 caps daily. Adequate hydration. If not resolved in 48-72 hours we need to consider urology evaluation.  Please be sure medication list is accurate. If a new problem present, please set up appointment sooner than planned today.

## 2021-03-15 NOTE — Progress Notes (Signed)
ACUTE VISIT Chief Complaint  Patient presents with   Urinary Tract Infection   HPI: [name] is a 76 y.o. female, who is here today complaining of 1-2 days of urinary symptoms.  Suprapubic pressure like pain when voiding, cloudy urine. Negative for dysuria.  Urinary Frequency  This is a recurrent problem. The problem occurs intermittently. The problem has been gradually worsening. The quality of the pain is described as aching. The pain is at a severity of 5/10. The pain is moderate. There has been no fever. She is Not sexually active. There is No history of pyelonephritis. Associated symptoms include frequency and urgency. Pertinent negatives include no chills, discharge, flank pain, hesitancy, nausea, possible pregnancy, sweats or vomiting. She has tried antibiotics for the symptoms.  She has taken Azo, which has helped.  She was seen on 02/28/21 because urinary symptoms.  Component Ref Range & Units 2 wk ago  Color, UA  yellow   Clarity, UA  cloudy   Glucose, UA Negative Negative   Bilirubin, UA  positive   Ketones, UA  negative   Spec Grav, UA 1.010 - 1.025 >=1.030 Abnormal    Blood, UA  positive   pH, UA 5.0 - 8.0 6.0   Protein, UA Negative Positive Abnormal    Urobilinogen, UA 0.2 or 1.0 E.U./dL 0.2   Nitrite, UA  negative   Leukocytes, UA Negative Large (3+) Abnormal    Appearance     Ucx 02/28/21 grew 10,000-49,000 CFU/mL of Proteus mirabilis. She completed treatment with Augmentin.Symptoms improved for a few days.  Susceptibility   Proteus mirabilis    URINE CULTURE, REFLEX    AMOX/CLAVULANIC <=2  Sensitive    AMPICILLIN <=2  Sensitive    AMPICILLIN/SULBACTAM <=2  Sensitive    CEFAZOLIN <=4  Not Reportable 1    CEFEPIME <=1  Sensitive    CEFTAZIDIME <=1  Sensitive    CEFTRIAXONE <=1  Sensitive    CIPROFLOXACIN <=0.25  Sensitive    GENTAMICIN <=1  Sensitive    LEVOFLOXACIN <=0.12  Sensitive    NITROFURANTOIN 64  Resistant    PIP/TAZO <=4   Sensitive    TOBRAMYCIN <=1  Sensitive    TRIMETH/SULFA <=20  Sensitive 2           Review of Systems  Constitutional:  Positive for fatigue. Negative for chills.  Respiratory:  Negative for shortness of breath.   Gastrointestinal:  Negative for abdominal pain, nausea and vomiting.  Endocrine: Negative for polydipsia and polyphagia.  Genitourinary:  Positive for frequency and urgency. Negative for flank pain and hesitancy.  Musculoskeletal:  Positive for back pain (No more than her usual.).  Skin:  Negative for rash.  Rest see pertinent positives and negatives per HPI.  Current Outpatient Medications on File Prior to Visit  Medication Sig Dispense Refill   COVID-19 mRNA bivalent vaccine, Pfizer, (PFIZER COVID-19 VAC BIVALENT) injection Inject into the muscle. 0.3 mL 0   COVID-19 mRNA Vac-TriS, Pfizer, SUSP injection Inject into the muscle. 0.3 mL 0   Dexlansoprazole (DEXILANT) 30 MG capsule DR Take 1 capsule (30 mg total) by mouth daily. 90 capsule 2   losartan (COZAAR) 50 MG tablet TAKE 1 TABLET(50 MG) BY MOUTH DAILY 90 tablet 2   Multiple Vitamin (MULTIVITAMIN) tablet Take 1 tablet by mouth.     Multiple Vitamins-Minerals (VITAMIN D3 COMPLETE PO) Take 5,000 Units by mouth daily.     PARoxetine (PAXIL) 10 MG tablet Take 1 tablet (10 mg total) by mouth  daily. 30 tablet 2   valACYclovir (VALTREX) 1000 MG tablet Take 2 tablets (2,000 mg total) by mouth at onset of outbreak then repeat in 12 hours 12 tablet 0   No current facility-administered medications on file prior to visit.   Past Medical History:  Diagnosis Date   Cataract 11/2018   GERD (gastroesophageal reflux disease)    Hyperlipidemia 09/07/2019   Hypertension 03/2015   Osteoporosis 09/07/2019   No Known Allergies  Social History   Socioeconomic History   Marital status: Divorced    Spouse name: Not on file   Number of children: Not on file   Years of education: Not on file   Highest education level: Not on file   Occupational History   Not on file  Tobacco Use   Smoking status: Never   Smokeless tobacco: Never  Substance and Sexual Activity   Alcohol use: Yes    Alcohol/week: 0.0 standard drinks    Comment: on weekends - 2 glasses wine.   Drug use: No   Sexual activity: Not on file  Other Topics Concern   Not on file  Social History Narrative   Not on file   Social Determinants of Health   Financial Resource Strain: Low Risk    Difficulty of Paying Living Expenses: Not hard at all  Food Insecurity: No Food Insecurity   Worried About Running Out of Food in the Last Year: Never true   [place] in the Last Year: Never true  Transportation Needs: No Transportation Needs   Lack of Transportation (Medical): No   Lack of Transportation (Non-Medical): No  Physical Activity: Inactive   Days of Exercise per Week: 0 days   Minutes of Exercise per Session: 0 min  Stress: No Stress Concern Present   Feeling of Stress : Not at all  Social Connections: Socially Isolated   Frequency of Communication with Friends and Family: More than three times a week   Frequency of Social Gatherings with Friends and Family: More than three times a week   Attends Religious Services: Never   Marine scientist or Organizations: No   Attends Archivist Meetings: Never   Marital Status: Divorced   Vitals:   03/15/21 0939  BP: 126/82  Pulse: 82  Resp: 16  Temp: 97.8 F (36.6 C)  SpO2: 98%   Body mass index is 25.82 kg/m.  Physical Exam Vitals and nursing note reviewed.  Constitutional:      General: She is not in acute distress.    Appearance: She is well-developed and well-groomed.  HENT:     Head: Normocephalic and atraumatic.  Eyes:     Conjunctiva/sclera: Conjunctivae normal.  Cardiovascular:     Rate and Rhythm: Normal rate and regular rhythm.  Pulmonary:     Effort: Pulmonary effort is normal. No respiratory distress.     Breath sounds: Normal breath sounds.   Abdominal:     Palpations: Abdomen is soft. There is no mass.     Tenderness: There is no abdominal tenderness. There is no right CVA tenderness or left CVA tenderness.  Skin:    General: Skin is warm.     Findings: No erythema.  Neurological:     General: No focal deficit present.     Mental Status: She is alert and oriented to person, place, and time.     Gait: Gait normal.   ASSESSMENT AND PLAN:  [name] was seen today for urinary tract infection.  Diagnoses  and all orders for this visit: Orders Placed This Encounter  Procedures   Urine Microscopic Only   POC Urinalysis Dipstick   Suspected UTI Several abnormalities on dipstick,some may be caused by Azo. Symptoms have been recurrent, temporal relief with Augmentin, which had positive susceptibility against proteus mirabilis. Bacteria was also susceptible to Bactrim, so 5 days treatment recommended. If symptoms are not resolved in 48-72 hours, urology referral will be recommended. Daily probiotic recommended. Adequate hydration. Clearly instructed about warning signs.  -     sulfamethoxazole-trimethoprim (BACTRIM DS) 800-160 MG tablet; Take 1 tablet by mouth 2 (two) times daily for 5 days.  Hematuria, unspecified type Urine dip with blood 2+. Urine microscopic ordered, further recommendations will be given accordingly.  Suprapubic abdominal pain Possible etiologies discussed. Pain was not elicit on examination today. I do not think imaging is needed today. Clearly instructed about warning signs.  Return if symptoms worsen or fail to improve.   G. Martinique, MD  [facility]. [place] office.

## 2021-04-01 ENCOUNTER — Telehealth: Payer: Self-pay | Admitting: Family Medicine

## 2021-04-01 NOTE — Telephone Encounter (Signed)
Left message for patient to call back and schedule Medicare Annual Wellness Visit (AWV) either virtually or in office. Left  my Karen Mcclain number [phone]   Last AWV 04/17/20  please schedule at anytime with [facility] Nurse Health Advisor 1 or 2  Awv can be schedule calendar year healthteam  This should be a 45 minute visit.

## 2021-05-08 ENCOUNTER — Telehealth: Payer: Self-pay | Admitting: Family Medicine

## 2021-05-08 NOTE — Telephone Encounter (Signed)
Left message for patient to call back and schedule Medicare Annual Wellness Visit (AWV) either virtually or in office. Left  my jabber number [phone]   Last AWV 04/17/20 ; please schedule at anytime with [facility] Nurse Health Advisor 1 or 2    

## 2021-05-13 ENCOUNTER — Encounter: Payer: Self-pay | Admitting: Family Medicine

## 2021-05-13 ENCOUNTER — Telehealth (INDEPENDENT_AMBULATORY_CARE_PROVIDER_SITE_OTHER): Payer: PPO | Admitting: Family Medicine

## 2021-05-13 ENCOUNTER — Other Ambulatory Visit (HOSPITAL_COMMUNITY): Payer: Self-pay

## 2021-05-13 VITALS — Ht 66.0 in

## 2021-05-13 DIAGNOSIS — M545 Low back pain, unspecified: Secondary | ICD-10-CM

## 2021-05-13 DIAGNOSIS — N390 Urinary tract infection, site not specified: Secondary | ICD-10-CM | POA: Diagnosis not present

## 2021-05-13 MED ORDER — SULFAMETHOXAZOLE-TRIMETHOPRIM 800-160 MG PO TABS
1.0000 | ORAL_TABLET | Freq: Two times a day (BID) | ORAL | 0 refills | Status: AC
  Filled 2021-05-13: qty 10, 5d supply, fill #0

## 2021-05-13 NOTE — Progress Notes (Signed)
Virtual Visit via Telephone Note ?I connected with Karen Mcclain on 05/13/21 at  4:00 PM EDT by telephone and verified that I am speaking with the correct person using two identifiers. ?  ?I discussed the limitations, risks, security and privacy concerns of performing an evaluation and management service by telephone and the availability of in person appointments. I also discussed with the patient that there may be a patient responsible charge related to this service. The patient expressed understanding and agreed to proceed. ? ?Location patient: home ?Location provider: work office ?Participants present for the call: patient, provider ?Patient did not have a visit in the prior 7 days to address this/these issue(s). ? ?Chief Complaint  ?Patient presents with  ? Urinary Tract Infection  ?  Started on Saturday, yesterday was uncomfortable. Started the [place]. Some burning when going to bathroom, does have urgency and back pain. Urine is cloudy.   ? ?History of Present Illness: ?Karen Mcclain is a 76 y.o.female with hx of vitamin D deficiency, hypertension, hyperlipidemia, and GERD complaining of urinary symptoms as described above. ? ?Urine has been cloudy, increased frequency,mild dysuria,and urgency. ?She was seen for similar symptoms on 03/15/2021 and 02/28/2021. ?She has not identified exacerbating factors. ? ?Urinary Tract Infection  ?This is a recurrent problem. The current episode started in the past 7 days. The problem has been unchanged. The quality of the pain is described as burning. The pain is at a severity of 4/10. The pain is mild. There has been no fever. There is No history of pyelonephritis. Associated symptoms include hesitancy and urgency. Pertinent negatives include no chills, discharge, flank pain, frequency, hematuria, sweats or vomiting. The treatment provided no relief. Her past medical history is significant for recurrent UTIs.  ?She has had right-sided back pain, she has history of lower  back pain but this pain seems to be different. ?It is not radiated. ? ?   ?Component 2 mo ago  ?MICRO NUMBER: [id number]   ?SPECIMEN QUALITY: Adequate   ?Sample Source NOT GIVEN   ?STATUS: FINAL   ?ISOLATE 1: Proteus mirabilis Abnormal    ?Comment: 10,000-49,000 CFU/mL of Proteus mirabilis  ?  ? ?Lab Results  ?Component Value Date  ? CREATININE 0.86 05/15/2020  ? BUN 17 05/15/2020  ? NA 139 05/15/2020  ? K 4.4 05/15/2020  ? CL 101 05/15/2020  ? CO2 29 05/15/2020  ? ?Observations/Objective: ?Patient sounds cheerful and well on the phone. ?I do not appreciate any SOB. ?Speech and thought processing are grossly intact. ?Patient reported vitals:Ht '5\' 6"'$  (1.676 m)   BMI 25.82 kg/m?  ? ?Assessment and Plan: ?Orders Placed This Encounter  ?Procedures  ? Ambulatory referral to Urology  ? ?1. Recurrent UTI (urinary tract infection) ?We discussed possible etiologies. ?She did not drop a urine sample today. ?We will treat empirically based on last urine culture, Bactrim DS twice daily x5 days. ?Because of recurrent urinary symptoms, referral to urology was placed. ?Continue adequate hydration. ?She was clearly instructed about warning signs. ? ?He may need prophylactic antibiotic treatment and/or vaginal Premarin if urologic work-up is otherwise negative. ? ?2. Right-sided low back pain without sciatica, unspecified chronicity ?Could be musculoskeletal. ?Monitor for changes. ? ?Follow Up Instructions: ? ?Return if symptoms worsen or fail to improve. ? ?I did not refer this patient for an OV in the next 24 hours for this/these issue(s). ? ?I discussed the assessment and treatment plan with the patient. The patient was provided an opportunity to ask questions  and all were answered. The patient agreed with the plan and demonstrated an understanding of the instructions. ?  ?The patient was advised to call back or seek an in-person evaluation if the symptoms worsen or if the condition fails to improve as anticipated. ? ?I provided   11 minutes of non-face-to-face time during this encounter. ? ? G. Martinique, MD ? ?[place]. ?[place] office. ? ? ?

## 2021-05-14 ENCOUNTER — Other Ambulatory Visit (HOSPITAL_COMMUNITY): Payer: Self-pay

## 2021-05-14 ENCOUNTER — Ambulatory Visit (INDEPENDENT_AMBULATORY_CARE_PROVIDER_SITE_OTHER): Payer: PPO

## 2021-05-14 VITALS — Ht 66.0 in | Wt 160.0 lb

## 2021-05-14 DIAGNOSIS — Z0000 Encounter for general adult medical examination without abnormal findings: Secondary | ICD-10-CM

## 2021-05-14 MED ORDER — LOSARTAN POTASSIUM 50 MG PO TABS
50.0000 mg | ORAL_TABLET | Freq: Every day | ORAL | 2 refills | Status: DC
  Filled 2021-05-14: qty 90, 90d supply, fill #0
  Filled 2021-08-11: qty 90, 90d supply, fill #1
  Filled 2021-11-27: qty 90, 90d supply, fill #2

## 2021-05-14 MED ORDER — LOSARTAN POTASSIUM 50 MG PO TABS: ORAL_TABLET | ORAL | 1 refills | Status: DC

## 2021-05-14 NOTE — Progress Notes (Signed)
? ?Subjective:  ? Karen Mcclain is a 76 y.o. female who presents for Medicare Annual (Subsequent) preventive examination. ? ?Review of Systems    ?Virtual Visit via Telephone Note ? ?I connected with  Karen Mcclain on 05/14/21 at  9:15 AM EDT by telephone and verified that I am speaking with the correct person using two identifiers. ? ?Location: ?Patient: Home ?Provider: Office ?Persons participating in the virtual visit: patient/Nurse Health Advisor ?  ?I discussed the limitations, risks, security and privacy concerns of performing an evaluation and management service by telephone and the availability of in person appointments. The patient expressed understanding and agreed to proceed. ? ?Interactive audio and video telecommunications were attempted between this nurse and patient, however failed, due to patient having technical difficulties OR patient did not have access to video capability.  We continued and completed visit with audio only. ? ?Some vital signs may be absent or patient reported.  ? ?Karen Peaches, LPN  ?Cardiac Risk Factors include: advanced age (>51mn, >>80women);hypertension ? ?   ?Objective:  ?  ?Today's Vitals  ? 05/14/21 0914  ?Weight: 160 lb (72.6 kg)  ?Height: '5\' 6"'$  (1.676 m)  ? ?Body mass index is 25.82 kg/m?. ? ? ?  05/14/2021  ?  9:25 AM 04/17/2020  ?  2:55 PM  ?Advanced Directives  ?Does Patient Have a Medical Advance Directive? Yes Yes  ?Type of [place]of [place] will [place] will  ?Does patient want to make changes to medical advance directive? No - Patient declined No - Patient declined  ?Copy of [place] Chart? No - copy requested No - copy requested  ? ? ?Current Medications (verified) ?Outpatient Encounter Medications as of 05/14/2021  ?Medication Sig  ? Dexlansoprazole (DEXILANT) 30 MG capsule DR Take 1 capsule (30 mg total) by mouth daily.  ? losartan (COZAAR) 50 MG tablet TAKE 1  TABLET(50 MG) BY MOUTH DAILY  ? Multiple Vitamin (MULTIVITAMIN) tablet Take 1 tablet by mouth.  ? Multiple Vitamins-Minerals (VITAMIN D3 COMPLETE PO) Take 5,000 Units by mouth daily.  ? PARoxetine (PAXIL) 10 MG tablet Take 1 tablet (10 mg total) by mouth daily.  ? sulfamethoxazole-trimethoprim (BACTRIM DS) 800-160 MG tablet Take 1 tablet by mouth 2 (two) times daily for 5 days.  ? valACYclovir (VALTREX) 1000 MG tablet Take 2 tablets (2,000 mg total) by mouth at onset of outbreak then repeat in 12 hours  ? ?No facility-administered encounter medications on file as of 05/14/2021.  ? ? ?Allergies (verified) ?Patient has no known allergies.  ? ?History: ?Past Medical History:  ?Diagnosis Date  ? Cataract 11/2018  ? GERD (gastroesophageal reflux disease)   ? Hyperlipidemia 09/07/2019  ? Hypertension 03/2015  ? Osteoporosis 09/07/2019  ? ?Past Surgical History:  ?Procedure Laterality Date  ? TUBAL LIGATION    ? ?Family History  ?Problem Relation Age of Onset  ? Heart disease Father   ? Heart disease Mother   ? ?Social History  ? ?Socioeconomic History  ? Marital status: Divorced  ?  Spouse name: Not on file  ? Number of children: Not on file  ? Years of education: Not on file  ? Highest education level: Not on file  ?Occupational History  ? Not on file  ?Tobacco Use  ? Smoking status: Never  ? Smokeless tobacco: Never  ?Substance and Sexual Activity  ? Alcohol use: Yes  ?  Alcohol/week: 0.0 standard drinks  ?  Comment:  on weekends - 2 glasses wine.  ? Drug use: No  ? Sexual activity: Not on file  ?Other Topics Concern  ? Not on file  ?Social History Narrative  ? Not on file  ? ?Social Determinants of Health  ? ?Financial Resource Strain: Low Risk   ? Difficulty of Paying Living Expenses: Not hard at all  ?Food Insecurity: No Food Insecurity  ? Worried About Charity fundraiser in the Last Year: Never true  ? Ran Out of Food in the Last Year: Never true  ?Transportation Needs: No Transportation Needs  ? Lack of Transportation  (Medical): No  ? Lack of Transportation (Non-Medical): No  ?Physical Activity: Inactive  ? Days of Exercise per Week: 0 days  ? Minutes of Exercise per Session: 0 min  ?Stress: No Stress Concern Present  ? Feeling of Stress : Only a little  ?Social Connections: Socially Isolated  ? Frequency of Communication with Friends and Family: More than three times a week  ? Frequency of Social Gatherings with Friends and Family: Three times a week  ? Attends Religious Services: Never  ? Active Member of Clubs or Organizations: No  ? Attends Archivist Meetings: Never  ? Marital Status: Divorced  ? ? ? ?Clinical Intake: ? ?Pre-visit preparation completed: Yes ? ?Pain : No/denies pain ? ?  ? ?BMI - recorded: 25.84 ?Nutritional Status: BMI 25 -29 Overweight ?Nutritional Risks: None ?Diabetes: No ? ?How often do you need to have someone help you when you read instructions, pamphlets, or other written materials from your doctor or pharmacy?: 1 - Never ? ?Diabetic?  No ? ?Activities of Daily Living ? ?  05/14/2021  ?  9:23 AM 05/13/2021  ? 11:35 AM  ?In your present state of health, do you have any difficulty performing the following activities:  ?Hearing? 0 0  ?Vision? 0 0  ?Difficulty concentrating or making decisions? 0 0  ?Walking or climbing stairs? 0 0  ?Dressing or bathing? 0 0  ?Doing errands, shopping? 0 0  ?Preparing Food and eating ? N N  ?Using the Toilet? N N  ?In the past six months, have you accidently leaked urine? Y Y  ?Comment Wears pads.Followed by PCP   ?Do you have problems with loss of bowel control? N N  ?Managing your Medications? N N  ?Managing your Finances? N N  ?Housekeeping or managing your Housekeeping? N N  ? ? ?Patient Care Team: ?Martinique, Betty G, MD as PCP - General (Family Medicine) ?Katy Apo, MD as Consulting Physician (Ophthalmology) ? ?Indicate any recent Medical Services you may have received from other than [facility] providers in the past year (date may be approximate). ? ?    ?Assessment:  ? This is a routine wellness examination for Karen Mcclain. ? ?Hearing/Vision screen ?Hearing Screening - Comments:: No difficulty hearing ?Vision Screening - Comments:: Wears glasses. Followed Dr Prudencio Burly ? ?Dietary issues and exercise activities discussed: ?Exercise limited by: None identified ? ? Goals Addressed   ? ?  ?  ?  ?  ?  ? This Visit's Progress  ?   Patient Stated (pt-stated)     ?   I would like to be more active. ?  ? ?  ? ?Depression Screen ? ?  05/14/2021  ?  9:20 AM 03/15/2021  ?  9:50 AM 02/28/2021  ?  8:59 AM 04/17/2020  ?  2:56 PM  ?PHQ 2/9 Scores  ?PHQ - 2 Score 0 0 0 0  ?  PHQ- 9 Score 0 1    ?  ?Fall Risk ? ?  05/14/2021  ?  9:24 AM 05/13/2021  ? 11:35 AM 05/10/2021  ? 11:01 AM 03/15/2021  ?  9:50 AM 02/28/2021  ?  8:59 AM  ?Fall Risk   ?Falls in the past year? 0 0 0  ? 0 0 0  ?Number falls in past yr: 0 0 0  ? 0    ?Injury with Fall? 0 0 0  ? 0    ?Risk for fall due to : No Fall Risks      ? ? ?FALL RISK PREVENTION PERTAINING TO THE HOME: ? ?Any stairs in or around the home? Yes  ?If so, are there any without handrails? Yes ?Home free of loose throw rugs in walkways, pet beds, electrical cords, etc? Yes  ?Adequate lighting in your home to reduce risk of falls? Yes  ? ?ASSISTIVE DEVICES UTILIZED TO PREVENT FALLS: ? ?Life alert? No  ?Use of a cane, walker or w/c? No  ?Grab bars in the bathroom? No  ?Shower chair or bench in shower? Yes  ?Elevated toilet seat or a handicapped toilet? No  ? ?TIMED UP AND GO: ? ?Was the test performed? No . Audio Visit ? ?Cognitive Function: ?  ?  ?Immunizations ?Immunization History  ?Administered Date(s) Administered  ? Fluad Quad(high Dose 65+) 10/20/2019, 10/25/2020  ? Influenza Split 12/01/2006  ? Influenza-Unspecified 11/03/2013  ? PFIZER Comirnaty(Gray Top)Covid-19 Tri-Sucrose Vaccine 06/13/2020  ? PFIZER(Purple Top)SARS-COV-2 Vaccination 03/14/2019, 04/08/2019, 12/14/2019  ? Pension scheme manager 30yr & up 12/06/2020  ? Pneumococcal  Conjugate-13 12/04/2013  ? Pneumococcal Polysaccharide-23 11/30/2008, 03/02/2020  ? Tdap 04/30/2009, 02/04/2019  ? Zoster Recombinat (Shingrix) 12/04/2017, 03/10/2018  ? Zoster, Live 02/04/2008, 10/28/2009  ? ? ?TDAP status:

## 2021-05-14 NOTE — Patient Instructions (Addendum)
?Karen Mcclain , ?Thank you for taking time to come for your Medicare Wellness Visit. I appreciate your ongoing commitment to your health goals. Please review the following plan we discussed and let me know if I can assist you in the future.  ? ?These are the goals we discussed: ? Goals   ? ?   Patient Stated (pt-stated)   ?   I would like to be more active. ?  ?   Weight (lb) < 165 lb (74.8 kg)   ? ?  ?  ?This is a list of the screening recommended for you and due dates:  ?Health Maintenance  ?Topic Date Due  ? Flu Shot  09/03/2021  ? Tetanus Vaccine  02/03/2029  ? Pneumonia Vaccine  Completed  ? DEXA scan (bone density measurement)  Completed  ? COVID-19 Vaccine  Completed  ? Hepatitis C Screening: USPSTF Recommendation to screen - Ages 51-79 yo.  Completed  ? Zoster (Shingles) Vaccine  Completed  ? HPV Vaccine  Aged Out  ? Colon Cancer Screening  Discontinued  ? ?Advanced directives: Yes Patient will bring copy ? ?Conditions/risks identified: None ? ?Next appointment: Follow up in one year for your annual wellness visit  ? ? ?Preventive Care 64 Years and Older, Female ?Preventive care refers to lifestyle choices and visits with your health care provider that can promote health and wellness. ?What does preventive care include? ?A yearly physical exam. This is also called an annual well check. ?Dental exams once or twice a year. ?Routine eye exams. Ask your health care provider how often you should have your eyes checked. ?Personal lifestyle choices, including: ?Daily care of your teeth and gums. ?Regular physical activity. ?Eating a healthy diet. ?Avoiding tobacco and drug use. ?Limiting alcohol use. ?Practicing safe sex. ?Taking low-dose aspirin every day. ?Taking vitamin and mineral supplements as recommended by your health care provider. ?What happens during an annual well check? ?The services and screenings done by your health care provider during your annual well check will depend on your age, overall  health, lifestyle risk factors, and family history of disease. ?Counseling  ?Your health care provider may ask you questions about your: ?Alcohol use. ?Tobacco use. ?Drug use. ?Emotional well-being. ?Home and relationship well-being. ?Sexual activity. ?Eating habits. ?History of falls. ?Memory and ability to understand (cognition). ?Work and work Statistician. ?Reproductive health. ?Screening  ?You may have the following tests or measurements: ?Height, weight, and BMI. ?Blood pressure. ?Lipid and cholesterol levels. These may be checked every 5 years, or more frequently if you are over 20 years old. ?Skin check. ?Lung cancer screening. You may have this screening every year starting at age 83 if you have a 30-pack-year history of smoking and currently smoke or have quit within the past 15 years. ?Fecal occult blood test (FOBT) of the stool. You may have this test every year starting at age 33. ?Flexible sigmoidoscopy or colonoscopy. You may have a sigmoidoscopy every 5 years or a colonoscopy every 10 years starting at age 60. ?Hepatitis C blood test. ?Hepatitis B blood test. ?Sexually transmitted disease (STD) testing. ?Diabetes screening. This is done by checking your blood sugar (glucose) after you have not eaten for a while (fasting). You may have this done every 1-3 years. ?Bone density scan. This is done to screen for osteoporosis. You may have this done starting at age 51. ?Mammogram. This may be done every 1-2 years. Talk to your health care provider about how often you should have regular mammograms. ?Talk  with your health care provider about your test results, treatment options, and if necessary, the need for more tests. ?Vaccines  ?Your health care provider may recommend certain vaccines, such as: ?Influenza vaccine. This is recommended every year. ?Tetanus, diphtheria, and acellular pertussis (Tdap, Td) vaccine. You may need a Td booster every 10 years. ?Zoster vaccine. You may need this after age  49. ?Pneumococcal 13-valent conjugate (PCV13) vaccine. One dose is recommended after age 38. ?Pneumococcal polysaccharide (PPSV23) vaccine. One dose is recommended after age 73. ?Talk to your health care provider about which screenings and vaccines you need and how often you need them. ?This information is not intended to replace advice given to you by your health care provider. Make sure you discuss any questions you have with your health care provider. ?Document Released: 02/16/2015 Document Revised: 10/10/2015 Document Reviewed: 11/21/2014 ?Elsevier Interactive Patient Education ? 2017 [employer]. ? ?Fall Prevention in the Home ?Falls can cause injuries. They can happen to people of all ages. There are many things you can do to make your home safe and to help prevent falls. ?What can I do on the outside of my home? ?Regularly fix the edges of walkways and driveways and fix any cracks. ?Remove anything that might make you trip as you walk through a door, such as a raised step or threshold. ?Trim any bushes or trees on the path to your home. ?Use bright outdoor lighting. ?Clear any walking paths of anything that might make someone trip, such as rocks or tools. ?Regularly check to see if handrails are loose or broken. Make sure that both sides of any steps have handrails. ?Any raised decks and porches should have guardrails on the edges. ?Have any leaves, snow, or ice cleared regularly. ?Use sand or salt on walking paths during winter. ?Clean up any spills in your garage right away. This includes oil or grease spills. ?What can I do in the bathroom? ?Use night lights. ?Install grab bars by the toilet and in the tub and shower. Do not use towel bars as grab bars. ?Use non-skid mats or decals in the tub or shower. ?If you need to sit down in the shower, use a plastic, non-slip stool. ?Keep the floor dry. Clean up any water that spills on the floor as soon as it happens. ?Remove soap buildup in the tub or shower  regularly. ?Attach bath mats securely with double-sided non-slip rug tape. ?Do not have throw rugs and other things on the floor that can make you trip. ?What can I do in the bedroom? ?Use night lights. ?Make sure that you have a light by your bed that is easy to reach. ?Do not use any sheets or blankets that are too big for your bed. They should not hang down onto the floor. ?Have a firm chair that has side arms. You can use this for support while you get dressed. ?Do not have throw rugs and other things on the floor that can make you trip. ?What can I do in the kitchen? ?Clean up any spills right away. ?Avoid walking on wet floors. ?Keep items that you use a lot in easy-to-reach places. ?If you need to reach something above you, use a strong step stool that has a grab bar. ?Keep electrical cords out of the way. ?Do not use floor polish or wax that makes floors slippery. If you must use wax, use non-skid floor wax. ?Do not have throw rugs and other things on the floor that can make you  trip. ?What can I do with my stairs? ?Do not leave any items on the stairs. ?Make sure that there are handrails on both sides of the stairs and use them. Fix handrails that are broken or loose. Make sure that handrails are as long as the stairways. ?Check any carpeting to make sure that it is firmly attached to the stairs. Fix any carpet that is loose or worn. ?Avoid having throw rugs at the top or bottom of the stairs. If you do have throw rugs, attach them to the floor with carpet tape. ?Make sure that you have a light switch at the top of the stairs and the bottom of the stairs. If you do not have them, ask someone to add them for you. ?What else can I do to help prevent falls? ?Wear shoes that: ?Do not have high heels. ?Have rubber bottoms. ?Are comfortable and fit you well. ?Are closed at the toe. Do not wear sandals. ?If you use a stepladder: ?Make sure that it is fully opened. Do not climb a closed stepladder. ?Make sure that  both sides of the stepladder are locked into place. ?Ask someone to hold it for you, if possible. ?Clearly mark and make sure that you can see: ?Any grab bars or handrails. ?First and last steps. ?Where the edge of ea

## 2021-05-22 ENCOUNTER — Other Ambulatory Visit (HOSPITAL_COMMUNITY): Payer: Self-pay

## 2021-05-22 ENCOUNTER — Encounter: Payer: Self-pay | Admitting: Family

## 2021-05-22 ENCOUNTER — Ambulatory Visit: Payer: PPO | Admitting: Family

## 2021-05-22 ENCOUNTER — Ambulatory Visit (INDEPENDENT_AMBULATORY_CARE_PROVIDER_SITE_OTHER): Payer: PPO

## 2021-05-22 DIAGNOSIS — M461 Sacroiliitis, not elsewhere classified: Secondary | ICD-10-CM

## 2021-05-22 DIAGNOSIS — M545 Low back pain, unspecified: Secondary | ICD-10-CM | POA: Diagnosis not present

## 2021-05-22 MED ORDER — METHOCARBAMOL 500 MG PO TABS
500.0000 mg | ORAL_TABLET | Freq: Four times a day (QID) | ORAL | 0 refills | Status: DC | PRN
  Filled 2021-05-22: qty 30, 8d supply, fill #0

## 2021-05-22 MED ORDER — PREDNISONE 50 MG PO TABS
ORAL_TABLET | ORAL | 0 refills | Status: DC
  Filled 2021-05-22: qty 5, 5d supply, fill #0

## 2021-05-22 NOTE — Progress Notes (Signed)
? ?Office Visit Note ?  ?Patient: Karen Mcclain           ?Date of Birth: [birth year]           ?MRN: [id number] ?Visit Date: 05/22/2021 ?             ?Requested by: Martinique, Betty G, MD ?[place] ?[place],  [place] [address] ?PCP: Martinique, Betty G, MD ? ?No chief complaint on file. ? ? ? ? ?HPI: ?The patient is a 76 year old woman who presents today concerned for some right-sided low back pain this has been going on for less than a week she woke 1 morning with significant pain and discomfort on her right side she has had gradual worsening of her pain was unable to get comfortable the pain woke her from sleep yesterday.  She is having some cramping type pain spasming in her right-sided low back.  The pain is becoming more constant she does not have any numbness tingling radicular type symptoms no red flag symptoms ? ?Her history is significant for osteoporosis she did have a recent bone density scan she states she is taking vitamin D as well as a daily multivitamin has not had any recent falls or injuries ? ?Assessment & Plan: ?Visit Diagnoses:  ?1. Right-sided low back pain without sciatica, unspecified chronicity   ? ? ?Plan: We will trial her on a prednisone burst as well as well as some Robaxin for the muscle spasms.  Discussed possibility of using a lidocaine patch if we cannot ease her pain with the prednisone.  If she does not get any relief consider MRI versus referral to Dr. Ernestina Patches for SI injection. ? ?Follow-Up Instructions: No follow-ups on file.  ? ?Back Exam  ? ?Tenderness  ?The patient is experiencing tenderness in the sacroiliac. ? ?Tests  ?Straight leg raise right: negative ?Straight leg raise left: negative ? ?Other  ?Gait: abnormal  ? ? ? ? ?Patient is alert, oriented, no adenopathy, well-dressed, normal affect, normal respiratory effort. ?Point tender to the right SI joint. ? ?Imaging: ?No results found. ?No images are attached to the encounter. ? ?Labs: ?No results found for: HGBA1C,  ESRSEDRATE, CRP, LABURIC, REPTSTATUS, GRAMSTAIN, CULT, LABORGA ? ? ?No results found for: ALBUMIN, PREALBUMIN, CBC ? ?No results found for: MG ?Lab Results  ?Component Value Date  ? VD25OH 63 09/15/2019  ? ? ?No results found for: PREALBUMIN ?   ? View : No data to display.  ?  ?  ?  ? ? ? ?There is no height or weight on file to calculate BMI. ? ?Orders:  ?No orders of the defined types were placed in this encounter. ? ?No orders of the defined types were placed in this encounter. ? ? ? Procedures: ?No procedures performed ? ?Clinical Data: ?No additional findings. ? ?ROS: ? ?All other systems negative, except as noted in the HPI. ?Review of Systems ? ?Objective: ?Vital Signs: There were no vitals taken for this visit. ? ?Specialty Comments:  ?No specialty comments available. ? ?PMFS History: ?Patient Active Problem List  ? Diagnosis Date Noted  ? GERD (gastroesophageal reflux disease) 05/15/2020  ? Hot flashes, menopausal 05/15/2020  ? Hypertension, essential, benign 09/07/2019  ? Hyperlipidemia 09/07/2019  ? Vitamin D deficiency, unspecified 09/07/2019  ? Osteoporosis 09/07/2019  ? Upper airway cough syndrome 09/14/2014  ? ?Past Medical History:  ?Diagnosis Date  ? Cataract 11/2018  ? GERD (gastroesophageal reflux disease)   ? Hyperlipidemia 09/07/2019  ? Hypertension 03/2015  ?  Osteoporosis 09/07/2019  ?  ?Family History  ?Problem Relation Age of Onset  ? Heart disease Father   ? Heart disease Mother   ?  ?Past Surgical History:  ?Procedure Laterality Date  ? TUBAL LIGATION    ? ?Social History  ? ?Occupational History  ? Not on file  ?Tobacco Use  ? Smoking status: Never  ? Smokeless tobacco: Never  ?Substance and Sexual Activity  ? Alcohol use: Yes  ?  Alcohol/week: 0.0 standard drinks  ?  Comment: on weekends - 2 glasses wine.  ? Drug use: No  ? Sexual activity: Not on file  ? ? ? ? ? ?

## 2021-05-27 ENCOUNTER — Ambulatory Visit: Payer: PPO | Admitting: Orthopedic Surgery

## 2021-06-05 ENCOUNTER — Encounter: Payer: Self-pay | Admitting: Family

## 2021-06-05 ENCOUNTER — Ambulatory Visit: Payer: PPO | Admitting: Family

## 2021-06-05 DIAGNOSIS — M461 Sacroiliitis, not elsewhere classified: Secondary | ICD-10-CM | POA: Diagnosis not present

## 2021-06-05 DIAGNOSIS — M545 Low back pain, unspecified: Secondary | ICD-10-CM | POA: Diagnosis not present

## 2021-06-05 DIAGNOSIS — M549 Dorsalgia, unspecified: Secondary | ICD-10-CM

## 2021-06-07 NOTE — Progress Notes (Signed)
? ?Office Visit Note ?  ?Patient: Karen Mcclain           ?Date of Birth: 1945-10-19           ?MRN: [id number] ?Visit Date: 06/05/2021 ?             ?Requested by: Martinique, Betty G, MD ?[place] ?[place],  [place] [address] ?PCP: Martinique, Betty G, MD ? ?Chief Complaint  ?Patient presents with  ? Lower Back - Follow-up  ? ? ? ? ? ?HPI: ?The patient is a 76 year old woman who presents today in follow-up for lumbar radiculopathy.  She waited a course of prednisone and Robaxin and is now feeling much better she does continue to have some midline back pain and muscle aches in her upper and lower back ? ?She feels a lot of this may have begun when she attempted to mow her lawn many weeks ago she wonders if she will be able to resume her walking and her previous distance as well as resume mowing her lawn.  Wondering what exercises she could do to strengthen her back. ? ?Also concerned about her diagnosis of osteoporosis which was discussed ?She did take Fosamax many years ago but began a holiday in 2010 her last bone density scan was in 2021 which showed a bone mineral density of T -3.1 ? ?She was initially seen 2 weeks ago for some right-sided low back pain this has been going on for less than a week she woke 1 morning with significant pain and discomfort on her right side she has had gradual worsening of her pain was unable to get comfortable the pain woke her from sleep yesterday.  She is having some cramping type pain spasming in her right-sided low back.  The pain is becoming more constant she does not have any numbness tingling radicular type symptoms no red flag symptoms ? ?New she states she is taking vitamin D as well as a daily multivitamin has not had any recent falls or injuries ? ?Assessment & Plan: ?Visit Diagnoses:  ?1. Right-sided low back pain without sciatica, unspecified chronicity   ?2. Sacroiliitis, not elsewhere classified ([place])   ?3. Upper back pain   ? ? ?Plan: If she does have return of  her symptoms we will consider MRI versus referral to Dr. Ernestina Patches for SI injection.  Discussed possibility of bisphosphonate therapy for her osteoporosis.  She will think about this.  Did order some physical therapy to work on her spine strengthening.  She will follow-up in the office as needed ? ?Follow-Up Instructions: No follow-ups on file.  ? ?Back Exam  ? ?Tests  ?Straight leg raise right: negative ?Straight leg raise left: negative ? ?Other  ?Gait: abnormal  ? ? ? ? ?Patient is alert, oriented, no adenopathy, well-dressed, normal affect, normal respiratory effort. ?Point tender to the right SI joint. ? ?Imaging: ?No results found. ?No images are attached to the encounter. ? ?Labs: ?No results found for: HGBA1C, ESRSEDRATE, CRP, LABURIC, REPTSTATUS, GRAMSTAIN, CULT, LABORGA ? ? ?No results found for: ALBUMIN, PREALBUMIN, CBC ? ?No results found for: MG ?Lab Results  ?Component Value Date  ? VD25OH 63 09/15/2019  ? ? ?No results found for: PREALBUMIN ?   ? View : No data to display.  ?  ?  ?  ? ? ? ? ?There is no height or weight on file to calculate BMI. ? ?Orders:  ?Orders Placed This Encounter  ?Procedures  ? Ambulatory referral to Physical  Therapy  ? ? ?No orders of the defined types were placed in this encounter. ? ? ? Procedures: ?No procedures performed ? ?Clinical Data: ?No additional findings. ? ?ROS: ? ?All other systems negative, except as noted in the HPI. ?Review of Systems ? ?Objective: ?Vital Signs: There were no vitals taken for this visit. ? ?Specialty Comments:  ?No specialty comments available. ? ?PMFS History: ?Patient Active Problem List  ? Diagnosis Date Noted  ? GERD (gastroesophageal reflux disease) 05/15/2020  ? Hot flashes, menopausal 05/15/2020  ? Hypertension, essential, benign 09/07/2019  ? Hyperlipidemia 09/07/2019  ? Vitamin D deficiency, unspecified 09/07/2019  ? Osteoporosis 09/07/2019  ? Upper airway cough syndrome 09/14/2014  ? ?Past Medical History:  ?Diagnosis Date  ? Cataract  11/2018  ? GERD (gastroesophageal reflux disease)   ? Hyperlipidemia 09/07/2019  ? Hypertension 03/2015  ? Osteoporosis 09/07/2019  ?  ?Family History  ?Problem Relation Age of Onset  ? Heart disease Father   ? Heart disease Mother   ?  ?Past Surgical History:  ?Procedure Laterality Date  ? TUBAL LIGATION    ? ?Social History  ? ?Occupational History  ? Not on file  ?Tobacco Use  ? Smoking status: Never  ? Smokeless tobacco: Never  ?Substance and Sexual Activity  ? Alcohol use: Yes  ?  Alcohol/week: 0.0 standard drinks  ?  Comment: on weekends - 2 glasses wine.  ? Drug use: No  ? Sexual activity: Not on file  ? ? ? ? ? ?

## 2021-06-13 NOTE — Therapy (Signed)
?OUTPATIENT PHYSICAL THERAPY EVALUATION ? ? ?Patient Name: Karen Mcclain ?MRN: [id number] ?DOB:[birth year], 76 y.o., female ?Today's Date: 06/14/2021 ? ? PT End of Session - 06/14/21 0917   ? ? Visit Number 1   ? Number of Visits 20   ? Date for PT Re-Evaluation 08/23/21   ? Authorization Type [employer] $15 copay   ? Progress Note Due on Visit 10   ? PT Start Time [phone]   ? PT Stop Time 1003   ? PT Time Calculation (min) 37 min   ? Activity Tolerance Patient tolerated treatment well   ? Behavior During Therapy [facility] for tasks assessed/performed   ? ?  ?  ? ?  ? ? ?Past Medical History:  ?Diagnosis Date  ? Cataract 11/2018  ? GERD (gastroesophageal reflux disease)   ? Hyperlipidemia 09/07/2019  ? Hypertension 03/2015  ? Osteoporosis 09/07/2019  ? ?Past Surgical History:  ?Procedure Laterality Date  ? TUBAL LIGATION    ? ?Patient Active Problem List  ? Diagnosis Date Noted  ? GERD (gastroesophageal reflux disease) 05/15/2020  ? Hot flashes, menopausal 05/15/2020  ? Hypertension, essential, benign 09/07/2019  ? Hyperlipidemia 09/07/2019  ? Vitamin D deficiency, unspecified 09/07/2019  ? Osteoporosis 09/07/2019  ? Upper airway cough syndrome 09/14/2014  ? ? ?PCP: Martinique, Betty G. MD ? ?REFERRING PROVIDER: Suzan Slick, NP ? ?REFERRING DIAG: M54.50 (ICD-10-CM) - Right-sided low back pain without sciatica, unspecified chronicity ?M46.1 (ICD-10-CM) - Sacroiliitis, not elsewhere classified ([place]) ?M54.9 (ICD-10-CM) - Upper back pain ? ?THERAPY DIAG:  ?Other low back pain ? ?Pain in thoracic spine ? ?Abnormal posture ? ?Muscle weakness (generalized) ? ?Difficulty in walking, not elsewhere classified ? ?ONSET DATE: 05/2021 ? ?SUBJECTIVE:                                                                                                                                                                                          ? ?SUBJECTIVE STATEMENT: ?Pt indicated lengthy history related to bone density changes.  Pt indicated she spent  years working at desk and limited exercise routine.  Pt indicated about a month ago she was mowing yard and noticed back hurting a few days later.  Pt indicated severe pain c transfers and movement related to lower back.  Had steroid pack and muscle relaxer for almost a week.  Pt indicated she has recently started walking more (up to 1/2 mile) and trying to incorporate increased activity.  Pt indicated she isn't much into exercise but knows she needs to do some.  Denied trouble sleeping at this time.  ? ? ?PERTINENT HISTORY:  ?HTN, osteoporosis, hyperlipidemia, GERD ? ?  PAIN:  ?NPRS scale: at worst 5/10, currently 0/10 ?Pain location: back pain  ?Pain description: tightness, achy pain, was severe ?Aggravating factors: walking prolonged, sitting prolonged, some household cleaning (reaching) ?Relieving factors: medicine  ? ?PRECAUTIONS: None ? ?WEIGHT BEARING RESTRICTIONS No ? ?FALLS:  ?Has patient fallen in last 6 months? No ? ?LIVING ENVIRONMENT: ?Lives with: lives alone ?Lives in: House/apartment ?Stairs: Partial basement with stairs, " a couple steps in enter house" ? ? 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  ? ?PLOF: Independent, house/yard work ? ?PATIENT GOALS Reduce pain, strength back ? ? ?OBJECTIVE:  ? ?PATIENT SURVEYS:  ?06/14/2021:  FOTO intake: 56   prediced:   70 ? ?SCREENING FOR RED FLAGS: ?06/14/2021 ?Bowel or bladder incontinence: No ?Cauda equina syndrome: No ? ? ?COGNITION: ?06/14/2021 Overall cognitive status: Within functional limits for tasks assessed   ?  ?SENSATION: ?06/14/2021 WFL ? ?MUSCLE LENGTH: ?06/14/2021 ?Passive hamstring slr to 75 deg bilateral c no complaints noted.  Observed contralateral hip flexor tightness.  ? ?POSTURE:  ?06/14/2021 : reduced lumbar lordosis, increased thoracic kyphosis, mild forward head.  Mild Trunk deviation to Rt c lower Rt shoulder in stance and walking ? ?PALPATION: ?06/14/2021: no specific tenderness noted today within thoracic  and lumbar region ? ?LUMBAR ROM:  ? ?Active  AROM  ?06/14/2021  ?Flexion Movement to feet, bilateral posterior thigh tightness  ?Extension 75% WFL no complaints  ?Right lateral flexion Lateral head of fibula  ?Left lateral flexion Knee joint c tightness noted  ?Right rotation   ?Left rotation   ? (Blank rows = not tested) ? ?LE ROM: ? ? Right ?06/14/2021 Left ?06/14/2021  ?Hip flexion    ?Hip extension    ?Hip abduction    ?Hip adduction    ?Hip internal rotation    ?Hip external rotation    ?Knee flexion    ?Knee extension    ?Ankle dorsiflexion    ?Ankle plantarflexion    ?Ankle inversion    ?Ankle eversion    ? (Blank rows = not tested) ? ?LE MMT: ?   ?MMT Right ?06/14/2021 Left ?06/14/2021  ?Hip flexion 5/5 5/5  ?Hip extension    ?Hip abduction    ?Hip adduction    ?Hip internal rotation    ?Hip external rotation    ?Knee flexion 5/5 5/5  ?Knee extension 5/5 5/5  ?Ankle dorsiflexion 5/5 5/5  ?Ankle plantarflexion    ?Ankle inversion    ?Ankle eversion    ? (Blank rows = not tested) ? ?LUMBAR SPECIAL TESTS:  ?06/14/2021 (-) slump, crossed SLR bilateral ? ?FUNCTIONAL TESTS:  ?06/14/2021 18 inch chair trasfers : no UE on 1st attempt ? ?GAIT: ?06/14/2021 ?Independent ambulation, reduced hip extension noted bilateral c forward trunk lean and Rt lateral trunk noted.  ? ? ? ?TODAY'S TREATMENT  ? 06/14/2021:  ?  Therex:    ?   HEP instruction/performance c cues for techniques, handout provided.  Trial set performed of each for comprehension and symptom assessment.  See below for exercise list. ? ? ?PATIENT EDUCATION:  ?06/14/2021 ?Education details: HEP, POC ?Person educated: Patient ?Education method: Explanation, Demonstration, Verbal cues, and Handouts ?Education comprehension: verbalized understanding, returned demonstration, and verbal cues required ? ? ?HOME EXERCISE PROGRAM: ?06/14/2021 ?Access Code: [email or web address] ?URL: [email or web address] ?Date: 06/14/2021 ?Prepared by: Scot Jun ? ?Exercises ?- Supine  Lower Trunk Rotation  - 2-3 x daily - 7 x weekly - 1 sets - 3-5 reps - 15  hold ?- Supine Scapular Retraction  - 2-3 x daily - 7 x weekly - 1 sets - 10 reps - 5 hold ?- Supine Posterior Pelvic Tilt  - 2-3 x daily - 7 x weekly - 1 sets - 10 reps - 5 hold ?- Supine Bridge  - 2 x daily - 7 x weekly - 1-2 sets - 10 reps - 2 hold ?- Left Standing Lateral Shift Correction at Wall - Repetitions (Mirrored)  - 2-3 x daily - 7 x weekly - 1 sets - 10 reps - 3-5 hold ?- Supine Cervical Retraction with Towel  - 2-3 x daily - 7 x weekly - 1 sets - 10 reps - 5 hold ?- Gastroc Stretch on Wall  - 2-3 x daily - 7 x weekly - 1 sets - 3-5 reps - 10-15 hold ? ?ASSESSMENT: ? ?CLINICAL IMPRESSION: ?Patient is a 76  y.o. who comes to clinic with complaints of thoracic and low pain with mobility movement coordination deficits c postural changes that impair their ability to perform usual daily and recreational functional activities without increase difficulty/symptoms at this time.  Patient to benefit from skilled PT services to address impairments and limitations to improve to previous level of function without restriction secondary to condition.  ? ? ?OBJECTIVE IMPAIRMENTS decreased activity tolerance, decreased coordination, decreased endurance, decreased mobility, difficulty walking, decreased ROM, decreased strength, hypomobility, impaired perceived functional ability, increased muscle spasms, impaired flexibility, improper body mechanics, postural dysfunction, and pain.  ? ?ACTIVITY LIMITATIONS cleaning, community activity, occupation, and yard work.  ? ?PERSONAL FACTORS  HTN, osteoporosis, hyperlipidemia, GERD  are also affecting patient's functional outcome.  ? ? ?REHAB POTENTIAL: Good ? ?CLINICAL DECISION MAKING: Stable/uncomplicated ? ?EVALUATION COMPLEXITY: Low ? ? ?GOALS: ?Goals reviewed with patient? Yes ? ?Short term PT Goals (target date for Short term goals are 3 weeks 07/05/2021) ?Patient will demonstrate independent use of  home exercise program to maintain progress from in clinic treatments. ?Goal status: New ?  ?Long term PT goals (target dates for all long term goals are 10 weeks  08/23/2021 ) ? ?1. Patient will demonstrate/repor

## 2021-06-14 ENCOUNTER — Other Ambulatory Visit: Payer: Self-pay

## 2021-06-14 ENCOUNTER — Encounter: Payer: Self-pay | Admitting: Rehabilitative and Restorative Service Providers"

## 2021-06-14 ENCOUNTER — Ambulatory Visit: Payer: PPO | Admitting: Rehabilitative and Restorative Service Providers"

## 2021-06-14 DIAGNOSIS — R262 Difficulty in walking, not elsewhere classified: Secondary | ICD-10-CM

## 2021-06-14 DIAGNOSIS — M546 Pain in thoracic spine: Secondary | ICD-10-CM

## 2021-06-14 DIAGNOSIS — M6281 Muscle weakness (generalized): Secondary | ICD-10-CM

## 2021-06-14 DIAGNOSIS — R293 Abnormal posture: Secondary | ICD-10-CM | POA: Diagnosis not present

## 2021-06-14 DIAGNOSIS — M5459 Other low back pain: Secondary | ICD-10-CM

## 2021-06-26 ENCOUNTER — Encounter: Payer: PPO | Admitting: Physical Therapy

## 2021-07-03 ENCOUNTER — Telehealth: Payer: Self-pay | Admitting: Orthopedic Surgery

## 2021-07-03 ENCOUNTER — Other Ambulatory Visit (HOSPITAL_COMMUNITY): Payer: Self-pay

## 2021-07-03 NOTE — Telephone Encounter (Signed)
Pt called wanting to speak with erin about a medication.  Cb [phone]

## 2021-07-04 ENCOUNTER — Ambulatory Visit: Payer: PPO | Admitting: Rehabilitative and Restorative Service Providers"

## 2021-07-04 ENCOUNTER — Encounter: Payer: Self-pay | Admitting: Rehabilitative and Restorative Service Providers"

## 2021-07-04 DIAGNOSIS — R262 Difficulty in walking, not elsewhere classified: Secondary | ICD-10-CM

## 2021-07-04 DIAGNOSIS — M6281 Muscle weakness (generalized): Secondary | ICD-10-CM | POA: Diagnosis not present

## 2021-07-04 DIAGNOSIS — R293 Abnormal posture: Secondary | ICD-10-CM

## 2021-07-04 DIAGNOSIS — M5459 Other low back pain: Secondary | ICD-10-CM | POA: Diagnosis not present

## 2021-07-04 NOTE — Therapy (Signed)
OUTPATIENT PHYSICAL THERAPY TREATMENT NOTE   Patient Name: AMBIKA ZETTLEMOYER MRN: [id number] DOB:05-Aug-1945, 76 y.o., female Today's Date: 07/04/2021  PCP: Betty G Martinique, MD REFERRING PROVIDER: Suzan Slick, NP  END OF SESSION:   PT End of Session - 07/04/21 1602     Visit Number 2    Number of Visits 20    Date for PT Re-Evaluation 08/23/21    Authorization Type [employer] $15 copay    Progress Note Due on Visit 10    PT Start Time 1761    PT Stop Time 1600    PT Time Calculation (min) 45 min    Activity Tolerance Patient tolerated treatment well;No increased pain    Behavior During Therapy [facility] for tasks assessed/performed             Past Medical History:  Diagnosis Date   Cataract 11/2018   GERD (gastroesophageal reflux disease)    Hyperlipidemia 09/07/2019   Hypertension 03/2015   Osteoporosis 09/07/2019   Past Surgical History:  Procedure Laterality Date   TUBAL LIGATION     Patient Active Problem List   Diagnosis Date Noted   GERD (gastroesophageal reflux disease) 05/15/2020   Hot flashes, menopausal 05/15/2020   Hypertension, essential, benign 09/07/2019   Hyperlipidemia 09/07/2019   Vitamin D deficiency, unspecified 09/07/2019   Osteoporosis 09/07/2019   Upper airway cough syndrome 09/14/2014    REFERRING DIAG: M54.50 (ICD-10-CM) - Right-sided low back pain without sciatica, unspecified chronicity M46.1 (ICD-10-CM) - Sacroiliitis, not elsewhere classified ([place]) M54.9 (ICD-10-CM) - Upper back pain  THERAPY DIAG:  Difficulty in walking, not elsewhere classified  Abnormal posture  Muscle weakness (generalized)  Other low back pain  Rationale for Evaluation and Treatment Rehabilitation  PERTINENT HISTORY: HTN, osteoporosis, hyperlipidemia, GERD  PRECAUTIONS: Back (avoid flexion, slouched postures)  SUBJECTIVE: Lissett had several questions with her early HEP.  She has been walking 7 days a week in addition to her HEP.  Pain is improved over the  past 3 weeks.  PAIN:  Are you having pain? Yes: NPRS scale: 0-3/10 Pain location: Lumbar spine and upper L gluteal Pain description: Sore and tired Aggravating factors: Flexed and prolonged postures Relieving factors: Rest and change of position   OBJECTIVE: (objective measures completed at initial evaluation unless otherwise dated) OBJECTIVE:    PATIENT SURVEYS:  06/14/2021:  FOTO intake: 56   prediced:   70   SCREENING FOR RED FLAGS: 06/14/2021 Bowel or bladder incontinence: No Cauda equina syndrome: No     COGNITION: 06/14/2021 Overall cognitive status: Within functional limits for tasks assessed                                SENSATION: 06/14/2021 [facility]   MUSCLE LENGTH: 06/14/2021 Passive hamstring slr to 75 deg bilateral c no complaints noted.  Observed contralateral hip flexor tightness.    POSTURE:  06/14/2021 : reduced lumbar lordosis, increased thoracic kyphosis, mild forward head.  Mild Trunk deviation to Rt c lower Rt shoulder in stance and walking   PALPATION: 06/14/2021: no specific tenderness noted today within thoracic and lumbar region   LUMBAR ROM:    Active  AROM  06/14/2021  Flexion Movement to feet, bilateral posterior thigh tightness  Extension 75% WFL no complaints  Right lateral flexion Lateral head of fibula  Left lateral flexion Knee joint c tightness noted  Right rotation    Left rotation     (Blank rows =  not tested)   LE ROM:     Right 06/14/2021 Left 06/14/2021  Hip flexion      Hip extension      Hip abduction      Hip adduction      Hip internal rotation      Hip external rotation      Knee flexion      Knee extension      Ankle dorsiflexion      Ankle plantarflexion      Ankle inversion      Ankle eversion       (Blank rows = not tested)   LE MMT:                          MMT Right 06/14/2021 Left 06/14/2021  Hip flexion 5/5 5/5  Hip extension      Hip abduction      Hip adduction      Hip internal rotation      Hip  external rotation      Knee flexion 5/5 5/5  Knee extension 5/5 5/5  Ankle dorsiflexion 5/5 5/5  Ankle plantarflexion      Ankle inversion      Ankle eversion       (Blank rows = not tested)   LUMBAR SPECIAL TESTS:  06/14/2021 (-) slump, crossed SLR bilateral   FUNCTIONAL TESTS:  06/14/2021 18 inch chair trasfers : no UE on 1st attempt   GAIT: 06/14/2021 Independent ambulation, reduced hip extension noted bilateral c forward trunk lean and Rt lateral trunk noted.        TODAY'S TREATMENT  07/04/2021:        Standing gentle trunk extension within comfortable range 10X 3 seconds  Lateral shift correction at wall 10X 3 seconds        Standing shoulder blade pinch withhead in neutral 10X 5 seconds  Standing heel to toe balance with pelvic stabilization 10X 3 seconds  Pelvic rotation 10X 10 seconds (maintain normal lumbar curve)  Bridging 2 sets of 10 (1 set with hands above head) 2 second hold  Education/Functional Activities: Postural education, specifically with activities at the kitchen sink     06/14/2021:                 Therex:                                                             HEP instruction/performance c cues for techniques, handout provided.  Trial set performed of each for comprehension and symptom assessment.  See below for exercise list.     PATIENT EDUCATION:  06/14/2021 Education details: HEP, POC Person educated: Patient Education method: Explanation, Demonstration, Verbal cues, and Handouts Education comprehension: verbalized understanding, returned demonstration, and verbal cues required     HOME EXERCISE PROGRAM: Access Code: [email or web address] URL: [email or web address] Date: 07/04/2021 Prepared by: Vista Mink  Exercises - Supine Lower Trunk Rotation  - 2-3 x daily - 7 x weekly - 1 sets - 3-5 reps - 15 hold - Supine Bridge  - 2 x daily - 7 x weekly - 1-2 sets - 10 reps - 2 hold - Left Standing Lateral Shift Correction at Wall -  Repetitions (Mirrored)  - 1-2 x daily -  7 x weekly - 1 sets - 10 reps - 3-5 hold - Standing Scapular Retraction  - 5 x daily - 7 x weekly - 1 sets - 5 reps - 5 second hold - Heel Toe Raises with Counter Support  - 2-3 x daily - 7 x weekly - 1 sets - 10 reps - 3 seconds hold - Standing Lumbar Extension at [place]  - 5 x daily - 7 x weekly - 1 sets - 5 reps - 3 seconds hold  ASSESSMENT:   CLINICAL IMPRESSION: Solace is making progress with her pain during her early independent rehabilitation.  She held off certain exercises and had questions about others so we focused on exercises she had questions about and was doing.  Others were deferred and she was progressed with some postural correction and postural strength activities.  I anticipate she will meet LTGs in 4 weeks or less.    OBJECTIVE IMPAIRMENTS decreased activity tolerance, decreased coordination, decreased endurance, decreased mobility, difficulty walking, decreased ROM, decreased strength, hypomobility, impaired perceived functional ability, increased muscle spasms, impaired flexibility, improper body mechanics, postural dysfunction, and pain.    ACTIVITY LIMITATIONS cleaning, community activity, occupation, and yard work.    PERSONAL FACTORS  HTN, osteoporosis, hyperlipidemia, GERD  are also affecting patient's functional outcome.      REHAB POTENTIAL: Good   CLINICAL DECISION MAKING: Stable/uncomplicated   EVALUATION COMPLEXITY: Low     GOALS: Goals reviewed with patient? Yes   Short term PT Goals (target date for Short term goals are 3 weeks 07/05/2021) Patient will demonstrate independent use of home exercise program to maintain progress from in clinic treatments. Goal status: Reviewed and updated 07/04/2021   Long term PT goals (target dates for all long term goals are 10 weeks  08/23/2021 )   1. Patient will demonstrate/report pain at worst less than or equal to 2/10 to facilitate minimal limitation in daily activity  secondary to pain symptoms. Goal status: Progress noted 07/04/2021   2. Patient will demonstrate independent use of home exercise program to facilitate ability to maintain/progress functional gains from skilled physical therapy services. Goal status: New   3. Patient will demonstrate FOTO outcome > or = 70 % to indicate reduced disability due to condition. Goal status: New   4.  Patient will demonstrate lumbar extension 100 % WFL s symptoms to facilitate upright standing, walking posture at PLOF s limitation. Goal status: New   5.  Patient will demonstrate/report ability to perform walking for exercise, yardwork s restriction due to symptoms.    Goal status: New   6.  Patient will demonstrate lateral flexion lumbar to head of fibula bilateral s symptoms to facilitate improved mobility for daily activity.   Goal status: New       PLAN: PT FREQUENCY: 1-2x/week   PT DURATION: 10 weeks   PLANNED INTERVENTIONS: Therapeutic exercises, Therapeutic activity, Neuro Muscular re-education, Balance training, Gait training, Patient/Family education, Joint mobilization, Stair training, DME instructions, Dry Needling, Electrical stimulation, Cryotherapy, Moist heat, Taping, Ultrasound, Ionotophoresis '4mg'$ /ml Dexamethasone, and Manual therapy.  All included unless contraindicated.   PLAN FOR NEXT SESSION: Review HEP knowledge.   Continue focus on improved posterior chain activation.   Check hip flexor mobility in [place] test if preferred (addressed with standing hip/trunk extension 07/04/2021).       Farley Ly, PT, MPT 07/04/2021, 4:10 PM

## 2021-07-04 NOTE — Telephone Encounter (Signed)
Called and lm on vm to call the office back with specific questions so that I can relay that to [place] and call back with answers.

## 2021-07-08 DIAGNOSIS — L821 Other seborrheic keratosis: Secondary | ICD-10-CM | POA: Diagnosis not present

## 2021-07-08 DIAGNOSIS — L918 Other hypertrophic disorders of the skin: Secondary | ICD-10-CM | POA: Diagnosis not present

## 2021-07-08 DIAGNOSIS — D2272 Melanocytic nevi of left lower limb, including hip: Secondary | ICD-10-CM | POA: Diagnosis not present

## 2021-07-08 DIAGNOSIS — D1801 Hemangioma of skin and subcutaneous tissue: Secondary | ICD-10-CM | POA: Diagnosis not present

## 2021-07-08 DIAGNOSIS — L723 Sebaceous cyst: Secondary | ICD-10-CM | POA: Diagnosis not present

## 2021-07-08 DIAGNOSIS — Z85820 Personal history of malignant melanoma of skin: Secondary | ICD-10-CM | POA: Diagnosis not present

## 2021-07-08 DIAGNOSIS — D2372 Other benign neoplasm of skin of left lower limb, including hip: Secondary | ICD-10-CM | POA: Diagnosis not present

## 2021-07-08 DIAGNOSIS — L570 Actinic keratosis: Secondary | ICD-10-CM | POA: Diagnosis not present

## 2021-07-10 ENCOUNTER — Encounter: Payer: Self-pay | Admitting: Family

## 2021-07-10 ENCOUNTER — Ambulatory Visit: Payer: PPO | Admitting: Family

## 2021-07-10 DIAGNOSIS — M810 Age-related osteoporosis without current pathological fracture: Secondary | ICD-10-CM | POA: Diagnosis not present

## 2021-07-10 NOTE — Progress Notes (Signed)
Office Visit Note   Patient: Karen Mcclain           Date of Birth: Oct 18, 1945           MRN: [id number] Visit Date: 07/10/2021              Requested by: Martinique, Betty G, MD [address] [place],  [place] [address] PCP: Martinique, Betty G, MD  Chief Complaint  Patient presents with   Lower Back - Follow-up      HPI: The patient is a 76 year old woman who presents today to consider pharmaceutical management of her osteoporosis.  She is currently taking calcium and vitamin D3, 5000 international units daily.  But is interested in further management  We last saw her for her low back she has been in physical therapy working on strengthening.  States that overall her back is doing much better she has been working on walking briskly 7 days a week and is feeling well  Osteoporosis: She took Fosamax from 07/2002 to 05/2008. No hx of pathologic fracture. Last DEXA 2018. She is on Ca++ and vit D supplementation.  Assessment & Plan: Visit Diagnoses: No diagnosis found.  Plan: We will go ahead and set her up for DEXA scan this coming August as she will be due.  Discussed and offered Neurosurgeon.  Consider zoledronic acid IV infusion, as she did not tolerate p.o. bisphosphonate's well.  She is currently been on a greater than 10-year holiday.  Patient ultimately opted to continue with conservative measures she will stay on calcium and vitamin D  Follow-Up Instructions: No follow-ups on file.   Back Exam   Tenderness  The patient is experiencing no tenderness.   Muscle Strength  The patient has normal back strength.  Tests  Straight leg raise right: negative Straight leg raise left: negative      Patient is alert, oriented, no adenopathy, well-dressed, normal affect, normal respiratory effort.   Imaging: No results found. No images are attached to the encounter.  Labs: No results found for: HGBA1C, ESRSEDRATE, CRP, LABURIC, REPTSTATUS, GRAMSTAIN,  CULT, LABORGA   No results found for: ALBUMIN, PREALBUMIN, CBC  No results found for: MG Lab Results  Component Value Date   VD25OH 63 09/15/2019    No results found for: PREALBUMIN     View : No data to display.           There is no height or weight on file to calculate BMI.  Orders:  No orders of the defined types were placed in this encounter.  No orders of the defined types were placed in this encounter.    Procedures: No procedures performed  Clinical Data: No additional findings.  ROS:  All other systems negative, except as noted in the HPI. Review of Systems  Objective: Vital Signs: There were no vitals taken for this visit.  Specialty Comments:  No specialty comments available.  PMFS History: Patient Active Problem List   Diagnosis Date Noted   GERD (gastroesophageal reflux disease) 05/15/2020   Hot flashes, menopausal 05/15/2020   Hypertension, essential, benign 09/07/2019   Hyperlipidemia 09/07/2019   Vitamin D deficiency, unspecified 09/07/2019   Osteoporosis 09/07/2019   Upper airway cough syndrome 09/14/2014   Past Medical History:  Diagnosis Date   Cataract 11/2018   GERD (gastroesophageal reflux disease)    Hyperlipidemia 09/07/2019   Hypertension 03/2015   Osteoporosis 09/07/2019    Family History  Problem Relation Age of Onset   Heart  disease Father    Heart disease Mother     Past Surgical History:  Procedure Laterality Date   TUBAL LIGATION     Social History   Occupational History   Not on file  Tobacco Use   Smoking status: Never   Smokeless tobacco: Never  Substance and Sexual Activity   Alcohol use: Yes    Alcohol/week: 0.0 standard drinks    Comment: on weekends - 2 glasses wine.   Drug use: No   Sexual activity: Not on file

## 2021-07-11 ENCOUNTER — Encounter: Payer: PPO | Admitting: Rehabilitative and Restorative Service Providers"

## 2021-07-18 ENCOUNTER — Encounter: Payer: Self-pay | Admitting: Rehabilitative and Restorative Service Providers"

## 2021-07-18 ENCOUNTER — Ambulatory Visit: Payer: PPO | Admitting: Rehabilitative and Restorative Service Providers"

## 2021-07-18 DIAGNOSIS — R262 Difficulty in walking, not elsewhere classified: Secondary | ICD-10-CM

## 2021-07-18 DIAGNOSIS — M6281 Muscle weakness (generalized): Secondary | ICD-10-CM

## 2021-07-18 DIAGNOSIS — R293 Abnormal posture: Secondary | ICD-10-CM

## 2021-07-18 DIAGNOSIS — M5459 Other low back pain: Secondary | ICD-10-CM

## 2021-07-18 DIAGNOSIS — M546 Pain in thoracic spine: Secondary | ICD-10-CM

## 2021-07-18 NOTE — Therapy (Signed)
OUTPATIENT PHYSICAL THERAPY TREATMENT NOTE   Patient Name: Karen Mcclain MRN: [id number] DOB:May 12, 1945, 76 y.o., female Today's Date: 07/18/2021  PCP: Betty G Martinique, MD REFERRING PROVIDER: Suzan Slick, NP  END OF SESSION:   PT End of Session - 07/18/21 1021     Visit Number 3    Number of Visits 20    Date for PT Re-Evaluation 08/23/21    Authorization Type [employer] $15 copay    Progress Note Due on Visit 10    PT Start Time 1011    PT Stop Time 1051    PT Time Calculation (min) 40 min    Activity Tolerance Patient tolerated treatment well;No increased pain    Behavior During Therapy [facility] for tasks assessed/performed              Past Medical History:  Diagnosis Date   Cataract 11/2018   GERD (gastroesophageal reflux disease)    Hyperlipidemia 09/07/2019   Hypertension 03/2015   Osteoporosis 09/07/2019   Past Surgical History:  Procedure Laterality Date   TUBAL LIGATION     Patient Active Problem List   Diagnosis Date Noted   GERD (gastroesophageal reflux disease) 05/15/2020   Hot flashes, menopausal 05/15/2020   Hypertension, essential, benign 09/07/2019   Hyperlipidemia 09/07/2019   Vitamin D deficiency, unspecified 09/07/2019   Osteoporosis 09/07/2019   Upper airway cough syndrome 09/14/2014    REFERRING DIAG: M54.50 (ICD-10-CM) - Right-sided low back pain without sciatica, unspecified chronicity M46.1 (ICD-10-CM) - Sacroiliitis, not elsewhere classified ([place]) M54.9 (ICD-10-CM) - Upper back pain  THERAPY DIAG:  Other low back pain  Pain in thoracic spine  Muscle weakness (generalized)  Difficulty in walking, not elsewhere classified  Abnormal posture  Rationale for Evaluation and Treatment Rehabilitation  PERTINENT HISTORY: HTN, osteoporosis, hyperlipidemia, GERD  PRECAUTIONS: Back (avoid flexion, slouched postures)  SUBJECTIVE: Pt indicated having discomfort more than pain at this time.    PAIN:  Are you having pain? Yes: NPRS  scale: < 3 /10 Pain location: Lumbar spine Pain description: discomfort Aggravating factors: standing prolonged/bend activity Relieving factors: exercise can help improve at times, changing positioning from static posture   OBJECTIVE:    PATIENT SURVEYS:  07/18/2021:  FOTO update:  65  06/14/2021:  FOTO intake: 56   prediced:   70   SCREENING FOR RED FLAGS: 06/14/2021 Bowel or bladder incontinence: No Cauda equina syndrome: No     COGNITION: 06/14/2021 Overall cognitive status: Within functional limits for tasks assessed                                SENSATION: 06/14/2021 [facility]   MUSCLE LENGTH: 06/14/2021 Passive hamstring slr to 75 deg bilateral c no complaints noted.  Observed contralateral hip flexor tightness.    POSTURE:  07/18/2021:  Lateral shift reduced but still slightly present (shoulder to Rt).  Thoracic kyphosis increase still noted  06/14/2021 : reduced lumbar lordosis, increased thoracic kyphosis, mild forward head.  Mild Trunk deviation to Rt c lower Rt shoulder in stance and walking   PALPATION: 06/14/2021: no specific tenderness noted today within thoracic and lumbar region   LUMBAR ROM:    Active  AROM  06/14/2021 AROM 07/18/2021  Flexion Movement to feet, bilateral posterior thigh tightness   Extension 75% WFL no complaints 75% WFL no complaints  Right lateral flexion Lateral head of fibula   Left lateral flexion Knee joint c tightness noted  Right rotation     Left rotation      (Blank rows = not tested)   LE ROM:     Right 06/14/2021 Left 06/14/2021  Hip flexion      Hip extension      Hip abduction      Hip adduction      Hip internal rotation      Hip external rotation      Knee flexion      Knee extension      Ankle dorsiflexion      Ankle plantarflexion      Ankle inversion      Ankle eversion       (Blank rows = not tested)   LE MMT:                          MMT Right 06/14/2021 Left 06/14/2021  Hip flexion 5/5 5/5  Hip extension       Hip abduction      Hip adduction      Hip internal rotation      Hip external rotation      Knee flexion 5/5 5/5  Knee extension 5/5 5/5  Ankle dorsiflexion 5/5 5/5  Ankle plantarflexion      Ankle inversion      Ankle eversion       (Blank rows = not tested)   LUMBAR SPECIAL TESTS:  06/14/2021 (-) slump, crossed SLR bilateral   FUNCTIONAL TESTS:  06/14/2021 18 inch chair trasfers : no UE on 1st attempt   GAIT: 06/14/2021 Independent ambulation, reduced hip extension noted bilateral c forward trunk lean and Rt lateral trunk noted.       TODAY'S TREATMENT   07/18/2021:  Therex:   HEP review and verbal cues for continued use and frequency for self care and management of symptoms.  Standing lumbar extension x 10 (hands on hips - Pt preferred) Seated scapular retraction x 5 (5 sec hold)  Osteoporosis program: Supine cervical retraction 5 sec hold x 10 Supine scapular retraction 5 sec hold x 10  Supine GH extension into table 5 sec hold x 10 Supine hip extension straight leg into table 5 sec hold x 10 bilateral Seated thoracic extension in chair 5 sec hold x 10    07/04/2021:        Standing gentle trunk extension within comfortable range 10X 3 seconds  Lateral shift correction at wall 10X 3 seconds        Standing shoulder blade pinch withhead in neutral 10X 5 seconds  Standing heel to toe balance with pelvic stabilization 10X 3 seconds  Pelvic rotation 10X 10 seconds (maintain normal lumbar curve)  Bridging 2 sets of 10 (1 set with hands above head) 2 second hold  Education/Functional Activities: Postural education, specifically with activities at the kitchen sink     06/14/2021:                 Therex:                                                             HEP instruction/performance c cues for techniques, handout provided.  Trial set performed of each for comprehension and symptom assessment.  See below for exercise list.  PATIENT EDUCATION:   06/14/2021 Education details: HEP, POC Person educated: Patient Education method: Consulting civil engineer, Demonstration, Verbal cues, and Handouts Education comprehension: verbalized understanding, returned demonstration, and verbal cues required     HOME EXERCISE PROGRAM: Access Code: [email or web address] URL: [email or web address] Date: 07/18/2021 Prepared by: Scot Jun  Exercises - Supine Lower Trunk Rotation  - 2-3 x daily - 7 x weekly - 1 sets - 3-5 reps - 15 hold - Supine Bridge  - 2 x daily - 7 x weekly - 1-2 sets - 10 reps - 2 hold - Left Standing Lateral Shift Correction at Wall - Repetitions (Mirrored)  - 1-2 x daily - 7 x weekly - 1 sets - 10 reps - 3-5 hold - Standing Scapular Retraction  - 5 x daily - 7 x weekly - 1 sets - 5 reps - 5 second hold - Supine Scapular Retraction  - 1-2 x daily - 7 x weekly - 1 sets - 10 reps - 5 hold - Supine Cervical Retraction with Towel  - 1-2 x daily - 7 x weekly - 1 sets - 10 reps - 5 hold - Gastroc Stretch on Wall  - 1-2 x daily - 7 x weekly - 1 sets - 5 reps - 30 hold - Heel Toe Raises with Counter Support  - 1-2 x daily - 7 x weekly - 1-2 sets - 10 reps - Seated Thoracic Lumbar Extension  - 1-2 x daily - 7 x weekly - 1 sets - 10 reps - 5 hold  ASSESSMENT:   CLINICAL IMPRESSION: General improvements and benefits from HEP noted.  Pt to benefit from use of daily osteoporosis program (posterior chain activation activity) in addition to symptom relief lumbar/thoracic mobility.  Pt continued progression towards item of transitioning towards HEP as appropriate    OBJECTIVE IMPAIRMENTS decreased activity tolerance, decreased coordination, decreased endurance, decreased mobility, difficulty walking, decreased ROM, decreased strength, hypomobility, impaired perceived functional ability, increased muscle spasms, impaired flexibility, improper body mechanics, postural dysfunction, and pain.    ACTIVITY LIMITATIONS cleaning, community activity,  occupation, and yard work.    PERSONAL FACTORS  HTN, osteoporosis, hyperlipidemia, GERD  are also affecting patient's functional outcome.      REHAB POTENTIAL: Good   CLINICAL DECISION MAKING: Stable/uncomplicated   EVALUATION COMPLEXITY: Low     GOALS: Goals reviewed with patient? Yes   Short term PT Goals (target date for Short term goals are 3 weeks 07/05/2021) Patient will demonstrate independent use of home exercise program to maintain progress from in clinic treatments. Goal status: Reviewed and updated 07/04/2021   Long term PT goals (target dates for all long term goals are 10 weeks  08/23/2021 )   1. Patient will demonstrate/report pain at worst less than or equal to 2/10 to facilitate minimal limitation in daily activity secondary to pain symptoms. Goal status: on going 07/18/2021   2. Patient will demonstrate independent use of home exercise program to facilitate ability to maintain/progress functional gains from skilled physical therapy services. Goal status: on going 07/18/2021   3. Patient will demonstrate FOTO outcome > or = 70 % to indicate reduced disability due to condition. Goal status: on going 07/18/2021   4.  Patient will demonstrate lumbar extension 100 % WFL s symptoms to facilitate upright standing, walking posture at PLOF s limitation. Goal status: on going 07/18/2021   5.  Patient will demonstrate/report ability to perform walking for exercise, yardwork s restriction due to symptoms.    Goal status: on going 07/18/2021  6.  Patient will demonstrate lateral flexion lumbar to head of fibula bilateral s symptoms to facilitate improved mobility for daily activity.   Goal status: on going 07/18/2021       PLAN: PT FREQUENCY: 1-2x/week   PT DURATION: 10 weeks   PLANNED INTERVENTIONS: Therapeutic exercises, Therapeutic activity, Neuro Muscular re-education, Balance training, Gait training, Patient/Family education, Joint mobilization, Stair training, DME  instructions, Dry Needling, Electrical stimulation, Cryotherapy, Moist heat, Taping, Ultrasound, Ionotophoresis '4mg'$ /ml Dexamethasone, and Manual therapy.  All included unless contraindicated.   PLAN FOR NEXT SESSION: Recheck general symptoms and posture in standing (lateral shift)     Scot Jun, PT, DPT, OCS, ATC 07/18/21  10:54 AM

## 2021-07-25 ENCOUNTER — Encounter: Payer: Self-pay | Admitting: Rehabilitative and Restorative Service Providers"

## 2021-07-25 ENCOUNTER — Ambulatory Visit: Payer: PPO | Admitting: Rehabilitative and Restorative Service Providers"

## 2021-07-25 DIAGNOSIS — M546 Pain in thoracic spine: Secondary | ICD-10-CM

## 2021-07-25 DIAGNOSIS — M6281 Muscle weakness (generalized): Secondary | ICD-10-CM

## 2021-07-25 DIAGNOSIS — R262 Difficulty in walking, not elsewhere classified: Secondary | ICD-10-CM

## 2021-07-25 DIAGNOSIS — M5459 Other low back pain: Secondary | ICD-10-CM | POA: Diagnosis not present

## 2021-07-25 DIAGNOSIS — R293 Abnormal posture: Secondary | ICD-10-CM | POA: Diagnosis not present

## 2021-07-25 NOTE — Therapy (Signed)
OUTPATIENT PHYSICAL THERAPY TREATMENT NOTE   Patient Name: Karen Mcclain MRN: [id number] DOB:04/17/1945, 76 y.o., female Today's Date: 07/25/2021  PCP: Betty G Martinique, MD REFERRING PROVIDER: Suzan Slick, NP  END OF SESSION:   PT End of Session - 07/25/21 1008     Visit Number 4    Number of Visits 20    Date for PT Re-Evaluation 08/23/21    Authorization Type [employer] $15 copay    Progress Note Due on Visit 10    PT Start Time 1009    PT Stop Time 1047    PT Time Calculation (min) 38 min    Activity Tolerance Patient tolerated treatment well    Behavior During Therapy [facility] for tasks assessed/performed               Past Medical History:  Diagnosis Date   Cataract 11/2018   GERD (gastroesophageal reflux disease)    Hyperlipidemia 09/07/2019   Hypertension 03/2015   Osteoporosis 09/07/2019   Past Surgical History:  Procedure Laterality Date   TUBAL LIGATION     Patient Active Problem List   Diagnosis Date Noted   GERD (gastroesophageal reflux disease) 05/15/2020   Hot flashes, menopausal 05/15/2020   Hypertension, essential, benign 09/07/2019   Hyperlipidemia 09/07/2019   Vitamin D deficiency, unspecified 09/07/2019   Osteoporosis 09/07/2019   Upper airway cough syndrome 09/14/2014    REFERRING DIAG: M54.50 (ICD-10-CM) - Right-sided low back pain without sciatica, unspecified chronicity M46.1 (ICD-10-CM) - Sacroiliitis, not elsewhere classified ([place]) M54.9 (ICD-10-CM) - Upper back pain  THERAPY DIAG:  Other low back pain  Pain in thoracic spine  Muscle weakness (generalized)  Difficulty in walking, not elsewhere classified  Abnormal posture  Rationale for Evaluation and Treatment Rehabilitation  PERTINENT HISTORY: HTN, osteoporosis, hyperlipidemia, GERD  PRECAUTIONS: Back (avoid flexion, slouched postures)  SUBJECTIVE:  Pt indicated feeling some improvement c use of HEP.  Rated pain 4/10 at most.    PAIN:  Are you having pain? Yes: NPRS  scale: < 4 /10 Pain location: Lumbar spine Pain description: discomfort Aggravating factors: standing prolonged/bend activity Relieving factors: exercise can help improve at times, changing positioning from static posture   OBJECTIVE:    PATIENT SURVEYS:  07/18/2021:  FOTO update:  65  06/14/2021:  FOTO intake: 56   prediced:   70   SCREENING FOR RED FLAGS: 06/14/2021 Bowel or bladder incontinence: No Cauda equina syndrome: No     COGNITION: 06/14/2021 Overall cognitive status: Within functional limits for tasks assessed                                SENSATION: 06/14/2021 [facility]   MUSCLE LENGTH: 06/14/2021 Passive hamstring slr to 75 deg bilateral c no complaints noted.  Observed contralateral hip flexor tightness.    POSTURE:  07/18/2021:  Lateral shift reduced but still slightly present (shoulder to Rt).  Thoracic kyphosis increase still noted  06/14/2021 : reduced lumbar lordosis, increased thoracic kyphosis, mild forward head.  Mild Trunk deviation to Rt c lower Rt shoulder in stance and walking   PALPATION: 06/14/2021: no specific tenderness noted today within thoracic and lumbar region   LUMBAR ROM:    Active  AROM  06/14/2021 AROM 07/18/2021  Flexion Movement to feet, bilateral posterior thigh tightness   Extension 75% WFL no complaints 75% WFL no complaints  Right lateral flexion Lateral head of fibula   Left lateral flexion Knee joint  c tightness noted   Right rotation     Left rotation      (Blank rows = not tested)   LE ROM:     Right 06/14/2021 Left 06/14/2021  Hip flexion      Hip extension      Hip abduction      Hip adduction      Hip internal rotation      Hip external rotation      Knee flexion      Knee extension      Ankle dorsiflexion      Ankle plantarflexion      Ankle inversion      Ankle eversion       (Blank rows = not tested)   LE MMT:                          MMT Right 06/14/2021 Left 06/14/2021  Hip flexion 5/5 5/5  Hip extension       Hip abduction      Hip adduction      Hip internal rotation      Hip external rotation      Knee flexion 5/5 5/5  Knee extension 5/5 5/5  Ankle dorsiflexion 5/5 5/5  Ankle plantarflexion      Ankle inversion      Ankle eversion       (Blank rows = not tested)   LUMBAR SPECIAL TESTS:  06/14/2021 (-) slump, crossed SLR bilateral   FUNCTIONAL TESTS:  06/14/2021 18 inch chair trasfers : no UE on 1st attempt   GAIT: 06/14/2021 Independent ambulation, reduced hip extension noted bilateral c forward trunk lean and Rt lateral trunk noted.      TODAY'S TREATMENT  07/25/2021:  Therex:   UBE fwd UE/LE 4 mins, reverse UE only 4 mins - lvl 3.0   Standing green band rows 2 x 15 c scapular retraction hold focus   Standing green band GH ext 2 x 15    Standing green band ER c scapular retraction bilateral 2 x 10 (cues for home use without band for mobility as well)   Lateral shift correction at wall (wall at Rt side) 2-3 sec hold x 10  Standing lumbar extension x 10 (hands on hips - Pt preferred) Nustep Lvl 6 6 mins UE/LE for gym based exercise demo    07/18/2021:  Therex:   HEP review and verbal cues for continued use and frequency for self care and management of symptoms.  Standing lumbar extension x 10 (hands on hips - Pt preferred) Seated scapular retraction x 5 (5 sec hold)  Osteoporosis program: Supine cervical retraction 5 sec hold x 10 Supine scapular retraction 5 sec hold x 10  Supine GH extension into table 5 sec hold x 10 Supine hip extension straight leg into table 5 sec hold x 10 bilateral Seated thoracic extension in chair 5 sec hold x 10    07/04/2021:        Standing gentle trunk extension within comfortable range 10X 3 seconds  Lateral shift correction at wall 10X 3 seconds        Standing shoulder blade pinch withhead in neutral 10X 5 seconds  Standing heel to toe balance with pelvic stabilization 10X 3 seconds  Pelvic rotation 10X 10 seconds (maintain  normal lumbar curve)  Bridging 2 sets of 10 (1 set with hands above head) 2 second hold  Education/Functional Activities: Postural education, specifically with activities  at the kitchen sink   PATIENT EDUCATION:  07/25/2021 Education details: HEP progression Person educated: Patient Education method: Consulting civil engineer, Demonstration, Verbal cues, and Handouts Education comprehension: verbalized understanding, returned demonstration, and verbal cues required     HOME EXERCISE PROGRAM: Access Code: [email or web address] URL: [email or web address] Date: 07/25/2021 Prepared by: Scot Jun  Exercises - Supine Lower Trunk Rotation  - 2-3 x daily - 7 x weekly - 1 sets - 3-5 reps - 15 hold - Supine Bridge  - 2 x daily - 7 x weekly - 1-2 sets - 10 reps - 2 hold - Left Standing Lateral Shift Correction at Wall - Repetitions (Mirrored)  - 1-2 x daily - 7 x weekly - 1 sets - 10 reps - 3-5 hold - Standing Scapular Retraction  - 5 x daily - 7 x weekly - 1 sets - 5 reps - 5 second hold - Supine Scapular Retraction  - 1-2 x daily - 7 x weekly - 1 sets - 10 reps - 5 hold - Supine Cervical Retraction with Towel  - 1-2 x daily - 7 x weekly - 1 sets - 10 reps - 5 hold - Gastroc Stretch on Wall  - 1-2 x daily - 7 x weekly - 1 sets - 5 reps - 30 hold - Seated Thoracic Lumbar Extension  - 1-2 x daily - 7 x weekly - 1 sets - 10 reps - 5 hold - Standing Lumbar Extension  - 2 x daily - 7 x weekly - 2-3 sets - 10 reps - Standing Shoulder Row with Anchored Resistance  - 1-2 x daily - 7 x weekly - 2 sets - 10-15 reps - Shoulder Extension with Resistance  - 1-2 x daily - 7 x weekly - 2 sets - 10-15 reps - Shoulder External Rotation and Scapular Retraction with Resistance  - 1-2 x daily - 7 x weekly - 2 sets - 10-15 reps  ASSESSMENT:   CLINICAL IMPRESSION: Current presentation of improvement in symptoms and good knowledge of HEP at this time.  Encouraged use of gym based workouts for continued progression in  activity level.  Recommend reduced frequency and follow up to check up on progress with goals to D/C when appropriate.    OBJECTIVE IMPAIRMENTS decreased activity tolerance, decreased coordination, decreased endurance, decreased mobility, difficulty walking, decreased ROM, decreased strength, hypomobility, impaired perceived functional ability, increased muscle spasms, impaired flexibility, improper body mechanics, postural dysfunction, and pain.    ACTIVITY LIMITATIONS cleaning, community activity, occupation, and yard work.    PERSONAL FACTORS  HTN, osteoporosis, hyperlipidemia, GERD  are also affecting patient's functional outcome.    REHAB POTENTIAL: Good   CLINICAL DECISION MAKING: Stable/uncomplicated   EVALUATION COMPLEXITY: Low     GOALS: Goals reviewed with patient? Yes   Short term PT Goals (target date for Short term goals are 3 weeks 07/05/2021) Patient will demonstrate independent use of home exercise program to maintain progress from in clinic treatments. Goal status: Reviewed and updated 07/04/2021   Long term PT goals (target dates for all long term goals are 10 weeks  08/23/2021 )   1. Patient will demonstrate/report pain at worst less than or equal to 2/10 to facilitate minimal limitation in daily activity secondary to pain symptoms. Goal status: on going 07/18/2021   2. Patient will demonstrate independent use of home exercise program to facilitate ability to maintain/progress functional gains from skilled physical therapy services. Goal status: on going 07/18/2021   3. Patient will demonstrate FOTO outcome > or =  70 % to indicate reduced disability due to condition. Goal status: on going 07/18/2021   4.  Patient will demonstrate lumbar extension 100 % WFL s symptoms to facilitate upright standing, walking posture at PLOF s limitation. Goal status: on going 07/18/2021   5.  Patient will demonstrate/report ability to perform walking for exercise, yardwork s restriction  due to symptoms.    Goal status: on going 07/18/2021   6.  Patient will demonstrate lateral flexion lumbar to head of fibula bilateral s symptoms to facilitate improved mobility for daily activity.   Goal status: on going 07/18/2021     PLAN: PT FREQUENCY: 1-2x/week   PT DURATION: 10 weeks   PLANNED INTERVENTIONS: Therapeutic exercises, Therapeutic activity, Neuro Muscular re-education, Balance training, Gait training, Patient/Family education, Joint mobilization, Stair training, DME instructions, Dry Needling, Electrical stimulation, Cryotherapy, Moist heat, Taping, Ultrasound, Ionotophoresis '4mg'$ /ml Dexamethasone, and Manual therapy.  All included unless contraindicated.   PLAN FOR NEXT SESSION: Progressive strengthening, possible d/c ?      Scot Jun, PT, DPT, OCS, ATC 07/25/21  10:49 AM

## 2021-07-31 ENCOUNTER — Other Ambulatory Visit: Payer: Self-pay | Admitting: Family

## 2021-07-31 ENCOUNTER — Other Ambulatory Visit (HOSPITAL_COMMUNITY): Payer: Self-pay

## 2021-07-31 MED ORDER — VITAMIN D3 20 MCG (800 UNIT) PO TABS
1.0000 | ORAL_TABLET | Freq: Every day | ORAL | 3 refills | Status: AC
  Filled 2021-07-31: qty 75, fill #0

## 2021-07-31 MED ORDER — VITAMIN D3 1.25 MG (50000 UT) PO CAPS
1.2500 mg | ORAL_CAPSULE | ORAL | 1 refills | Status: DC
  Filled 2021-07-31: qty 8, 56d supply, fill #0

## 2021-08-01 ENCOUNTER — Ambulatory Visit: Payer: PPO | Attending: Internal Medicine

## 2021-08-01 ENCOUNTER — Encounter: Payer: PPO | Admitting: Rehabilitative and Restorative Service Providers"

## 2021-08-01 ENCOUNTER — Other Ambulatory Visit (HOSPITAL_BASED_OUTPATIENT_CLINIC_OR_DEPARTMENT_OTHER): Payer: Self-pay

## 2021-08-01 DIAGNOSIS — Z23 Encounter for immunization: Secondary | ICD-10-CM

## 2021-08-01 MED ORDER — PFIZER COVID-19 VAC BIVALENT 30 MCG/0.3ML IM SUSP
INTRAMUSCULAR | 0 refills | Status: DC
  Filled 2021-08-01: qty 0.3, 1d supply, fill #0

## 2021-08-01 NOTE — Progress Notes (Signed)
   Covid-19 Vaccination Clinic  Name:  Karen Mcclain    MRN: [id number] DOB: Apr 11, 1945  08/01/2021  [name] was observed post Covid-19 immunization for 15 minutes without incident. She was provided with Vaccine Information Sheet and instruction to access the V-Safe system.   [name] was instructed to call 911 with any severe reactions post vaccine: Difficulty breathing  Swelling of face and throat  A fast heartbeat  A bad rash all over body  Dizziness and weakness   Immunizations Administered     Name Date Dose VIS Date Route   Pfizer Covid-19 Vaccine Bivalent Booster 08/01/2021  2:45 PM 0.3 mL 10/03/2020 Intramuscular   Manufacturer: [place]   Lot: [id number]   [place]: [phone]

## 2021-08-02 ENCOUNTER — Other Ambulatory Visit (HOSPITAL_BASED_OUTPATIENT_CLINIC_OR_DEPARTMENT_OTHER): Payer: Self-pay

## 2021-08-08 ENCOUNTER — Encounter: Payer: PPO | Admitting: Rehabilitative and Restorative Service Providers"

## 2021-08-12 ENCOUNTER — Other Ambulatory Visit (HOSPITAL_COMMUNITY): Payer: Self-pay

## 2021-08-14 ENCOUNTER — Other Ambulatory Visit: Payer: Self-pay | Admitting: Orthopedic Surgery

## 2021-08-14 DIAGNOSIS — M810 Age-related osteoporosis without current pathological fracture: Secondary | ICD-10-CM

## 2021-08-14 NOTE — Telephone Encounter (Signed)
Order placed for dexa scan to [facility]

## 2021-08-15 ENCOUNTER — Ambulatory Visit: Payer: PPO | Admitting: Rehabilitative and Restorative Service Providers"

## 2021-08-15 ENCOUNTER — Encounter: Payer: Self-pay | Admitting: Rehabilitative and Restorative Service Providers"

## 2021-08-15 DIAGNOSIS — R293 Abnormal posture: Secondary | ICD-10-CM | POA: Diagnosis not present

## 2021-08-15 DIAGNOSIS — R262 Difficulty in walking, not elsewhere classified: Secondary | ICD-10-CM | POA: Diagnosis not present

## 2021-08-15 DIAGNOSIS — M6281 Muscle weakness (generalized): Secondary | ICD-10-CM | POA: Diagnosis not present

## 2021-08-15 DIAGNOSIS — M5459 Other low back pain: Secondary | ICD-10-CM

## 2021-08-15 DIAGNOSIS — M546 Pain in thoracic spine: Secondary | ICD-10-CM

## 2021-08-15 NOTE — Therapy (Signed)
OUTPATIENT PHYSICAL THERAPY TREATMENT NOTE /DISCHARGE   Patient Name: Karen Mcclain MRN: [id number] DOB:January 20, 1946, 76 y.o., female Today's Date: 08/15/2021  PCP: Betty G Martinique, MD REFERRING PROVIDER: Suzan Slick, NP  PHYSICAL THERAPY DISCHARGE SUMMARY  Visits from Start of Care: 5  Current functional level related to goals / functional outcomes: See note   Remaining deficits: See note   Education / Equipment: HEP   Patient agrees to discharge. Patient goals were met. Patient is being discharged due to being pleased with the current functional level.   END OF SESSION:   PT End of Session - 08/15/21 1011     Visit Number 5    Number of Visits 20    Date for PT Re-Evaluation 08/23/21    Authorization Type [employer] $15 copay    Progress Note Due on Visit 10    PT Start Time 1013    PT Stop Time 1039    PT Time Calculation (min) 26 min    Activity Tolerance Patient tolerated treatment well    Behavior During Therapy [facility] for tasks assessed/performed                Past Medical History:  Diagnosis Date   Cataract 11/2018   GERD (gastroesophageal reflux disease)    Hyperlipidemia 09/07/2019   Hypertension 03/2015   Osteoporosis 09/07/2019   Past Surgical History:  Procedure Laterality Date   TUBAL LIGATION     Patient Active Problem List   Diagnosis Date Noted   GERD (gastroesophageal reflux disease) 05/15/2020   Hot flashes, menopausal 05/15/2020   Hypertension, essential, benign 09/07/2019   Hyperlipidemia 09/07/2019   Vitamin D deficiency, unspecified 09/07/2019   Osteoporosis 09/07/2019   Upper airway cough syndrome 09/14/2014    REFERRING DIAG: M54.50 (ICD-10-CM) - Right-sided low back pain without sciatica, unspecified chronicity M46.1 (ICD-10-CM) - Sacroiliitis, not elsewhere classified ([place]) M54.9 (ICD-10-CM) - Upper back pain  THERAPY DIAG:  Other low back pain  Pain in thoracic spine  Muscle weakness (generalized)  Difficulty  in walking, not elsewhere classified  Abnormal posture  Rationale for Evaluation and Treatment Rehabilitation  PERTINENT HISTORY: HTN, osteoporosis, hyperlipidemia, GERD  PRECAUTIONS: Back (avoid flexion, slouched postures)  SUBJECTIVE:  Indicated going to gym and successful use of HEP.   PAIN:  No pain upon arrival.    OBJECTIVE:    PATIENT SURVEYS:  08/15/2021: FOTO update 82  07/18/2021:  FOTO update:  65  06/14/2021:  FOTO intake: 56   prediced:   70   SCREENING FOR RED FLAGS: 06/14/2021 Bowel or bladder incontinence: No Cauda equina syndrome: No     COGNITION: 06/14/2021 Overall cognitive status: Within functional limits for tasks assessed                                SENSATION: 06/14/2021 [facility]   MUSCLE LENGTH: 06/14/2021 Passive hamstring slr to 75 deg bilateral c no complaints noted.  Observed contralateral hip flexor tightness.    POSTURE:  07/18/2021:  Lateral shift reduced but still slightly present (shoulder to Rt).  Thoracic kyphosis increase still noted  06/14/2021 : reduced lumbar lordosis, increased thoracic kyphosis, mild forward head.  Mild Trunk deviation to Rt c lower Rt shoulder in stance and walking   PALPATION: 06/14/2021: no specific tenderness noted today within thoracic and lumbar region   LUMBAR ROM:    Active  AROM  06/14/2021 AROM 07/18/2021 AROM 08/15/2021  Flexion Movement  to feet, bilateral posterior thigh tightness    Extension 75% WFL no complaints 75% WFL no complaints 100% WFL  Right lateral flexion Lateral head of fibula  Lateral head of fibula  Left lateral flexion Knee joint c tightness noted  Lateral head of fibula  Right rotation      Left rotation       (Blank rows = not tested)   LE ROM:     Right 06/14/2021 Left 06/14/2021  Hip flexion      Hip extension      Hip abduction      Hip adduction      Hip internal rotation      Hip external rotation      Knee flexion      Knee extension      Ankle dorsiflexion       Ankle plantarflexion      Ankle inversion      Ankle eversion       (Blank rows = not tested)   LE MMT:                          MMT Right 06/14/2021 Left 06/14/2021  Hip flexion 5/5 5/5  Hip extension      Hip abduction      Hip adduction      Hip internal rotation      Hip external rotation      Knee flexion 5/5 5/5  Knee extension 5/5 5/5  Ankle dorsiflexion 5/5 5/5  Ankle plantarflexion      Ankle inversion      Ankle eversion       (Blank rows = not tested)   LUMBAR SPECIAL TESTS:  06/14/2021 (-) slump, crossed SLR bilateral   FUNCTIONAL TESTS:  06/14/2021 18 inch chair trasfers : no UE on 1st attempt   GAIT: 06/14/2021 Independent ambulation, reduced hip extension noted bilateral c forward trunk lean and Rt lateral trunk noted.      TODAY'S TREATMENT  08/15/2021:  Therex:   UBE fwd UE/LE 4 mins, reverse UE only 4 mins - lvl 3.0    Extended time of review of HEP c cues verbally for techniques with review of handout.  Cues given and education about coutinued routine use going forward.    Demonstration of wall gastroc stretch c trial x 2 bilateral 15 sec holds c cues for UE flexion up wall for thoracic extension.      07/25/2021:  Therex:   UBE fwd UE/LE 4 mins, reverse UE only 4 mins - lvl 3.0   Standing green band rows 2 x 15 c scapular retraction hold focus   Standing green band GH ext 2 x 15    Standing green band ER c scapular retraction bilateral 2 x 10 (cues for home use without band for mobility as well)   Lateral shift correction at wall (wall at Rt side) 2-3 sec hold x 10  Standing lumbar extension x 10 (hands on hips - Pt preferred) Nustep Lvl 6 6 mins UE/LE for gym based exercise demo    07/18/2021:  Therex:   HEP review and verbal cues for continued use and frequency for self care and management of symptoms.  Standing lumbar extension x 10 (hands on hips - Pt preferred) Seated scapular retraction x 5 (5 sec hold)  Osteoporosis program: Supine  cervical retraction 5 sec hold x 10 Supine scapular retraction 5 sec hold x 10  Supine GH  extension into table 5 sec hold x 10 Supine hip extension straight leg into table 5 sec hold x 10 bilateral Seated thoracic extension in chair 5 sec hold x 10    PATIENT EDUCATION:  07/25/2021 Education details: HEP progression Person educated: Patient Education method: Consulting civil engineer, Media planner, Verbal cues, and Handouts Education comprehension: verbalized understanding, returned demonstration, and verbal cues required     HOME EXERCISE PROGRAM: Access Code: [email or web address] URL: [email or web address] Date: 07/25/2021 Prepared by: Scot Jun  Exercises - Supine Lower Trunk Rotation  - 2-3 x daily - 7 x weekly - 1 sets - 3-5 reps - 15 hold - Supine Bridge  - 2 x daily - 7 x weekly - 1-2 sets - 10 reps - 2 hold - Left Standing Lateral Shift Correction at Wall - Repetitions (Mirrored)  - 1-2 x daily - 7 x weekly - 1 sets - 10 reps - 3-5 hold - Standing Scapular Retraction  - 5 x daily - 7 x weekly - 1 sets - 5 reps - 5 second hold - Supine Scapular Retraction  - 1-2 x daily - 7 x weekly - 1 sets - 10 reps - 5 hold - Supine Cervical Retraction with Towel  - 1-2 x daily - 7 x weekly - 1 sets - 10 reps - 5 hold - Gastroc Stretch on Wall  - 1-2 x daily - 7 x weekly - 1 sets - 5 reps - 30 hold - Seated Thoracic Lumbar Extension  - 1-2 x daily - 7 x weekly - 1 sets - 10 reps - 5 hold - Standing Lumbar Extension  - 2 x daily - 7 x weekly - 2-3 sets - 10 reps - Standing Shoulder Row with Anchored Resistance  - 1-2 x daily - 7 x weekly - 2 sets - 10-15 reps - Shoulder Extension with Resistance  - 1-2 x daily - 7 x weekly - 2 sets - 10-15 reps - Shoulder External Rotation and Scapular Retraction with Resistance  - 1-2 x daily - 7 x weekly - 2 sets - 10-15 reps  ASSESSMENT:   CLINICAL IMPRESSION: Pt attended 5 visits overall during treatment c noted improvement in symptoms and FOTO update for  functional tasks improvements.  See objective data for progression as noted.  Good presentation and knowledge of HEP at this time.  Recommend d/c to HEP for continued maintenance.    OBJECTIVE IMPAIRMENTS decreased activity tolerance, decreased coordination, decreased endurance, decreased mobility, difficulty walking, decreased ROM, decreased strength, hypomobility, impaired perceived functional ability, increased muscle spasms, impaired flexibility, improper body mechanics, postural dysfunction, and pain.    ACTIVITY LIMITATIONS cleaning, community activity, occupation, and yard work.    PERSONAL FACTORS  HTN, osteoporosis, hyperlipidemia, GERD  are also affecting patient's functional outcome.    REHAB POTENTIAL: Good   CLINICAL DECISION MAKING: Stable/uncomplicated   EVALUATION COMPLEXITY: Low     GOALS: Goals reviewed with patient? Yes   Short term PT Goals (target date for Short term goals are 3 weeks 07/05/2021) Patient will demonstrate independent use of home exercise program to maintain progress from in clinic treatments. Goal status: Reviewed and updated 07/04/2021   Long term PT goals (target dates for all long term goals are 10 weeks  08/23/2021 )   1. Patient will demonstrate/report pain at worst less than or equal to 2/10 to facilitate minimal limitation in daily activity secondary to pain symptoms. Goal status: MET assessed 08/15/2021   2. Patient will demonstrate independent use of home exercise  program to facilitate ability to maintain/progress functional gains from skilled physical therapy services. Goal status: MET assessed 08/15/2021   3. Patient will demonstrate FOTO outcome > or = 70 % to indicate reduced disability due to condition. Goal status:MET assessed 08/15/2021   4.  Patient will demonstrate lumbar extension 100 % WFL s symptoms to facilitate upright standing, walking posture at PLOF s limitation. Goal status: MET assessed 08/15/2021   5.  Patient will  demonstrate/report ability to perform walking for exercise, yardwork s restriction due to symptoms.    Goal status: MET assessed 08/15/2021   6.  Patient will demonstrate lateral flexion lumbar to head of fibula bilateral s symptoms to facilitate improved mobility for daily activity.   Goal status: MET assessed 08/15/2021     PLAN: PT FREQUENCY: 1-2x/week   PT DURATION: 10 weeks   PLANNED INTERVENTIONS: Therapeutic exercises, Therapeutic activity, Neuro Muscular re-education, Balance training, Gait training, Patient/Family education, Joint mobilization, Stair training, DME instructions, Dry Needling, Electrical stimulation, Cryotherapy, Moist heat, Taping, Ultrasound, Ionotophoresis 4mg /ml Dexamethasone, and Manual therapy.  All included unless contraindicated.   PLAN FOR NEXT SESSION: Discharge to HEP     Scot Jun, PT, DPT, OCS, ATC 08/15/21  10:46 AM

## 2021-08-21 NOTE — Progress Notes (Unsigned)
ACUTE VISIT Chief Complaint  Patient presents with   Poison Ivy    X a week, having dots spread now.   HPI: Ms.Kleo L Langan is a 76 y.o. female, who is here today complaining of skin rash as described above. She has other concerns today.  Rash started on right forearm 2-3 days after doing yard work.  Rash This is a new problem. The current episode started in the past 7 days. The rash is characterized by itchiness and redness. Pertinent negatives include no congestion, cough, diarrhea, eye pain, facial edema, fever, nail changes, rhinorrhea, shortness of breath, sore throat or vomiting. Past treatments include topical steroids. The treatment provided moderate relief.  She used Clobetasol cream she had left from old prescription.  Negative for new medication, detergent, soap, or body product. No known insect bite . No sick contact. No Hx of eczema or similar rash in the past.  Last follow up on 05/15/20. Since her last visit she has followed with ortho for back pain and osteoporosis. Right> left low back pain, not radiated , exacerbated by prolonged sitting and standing. Negative for saddle anesthesia or bladder/bowel dysfunction. She is doing PT. Has questions about treatments if pain is persistent. There has been a discussion about lumbar MRI.   DEXA has been ordered by ortho, 09/2021. She is on Vit D 50,000 U weekly x 8 week. Took Fosamax x 5 years, the plan is to take it again if insurance does not pay for Prolia.  Hypertension on Losartan 50 mg daily. BP readings at home:She does not check it. Negative for unusual or severe headache, visual changes, exertional chest pain, dyspnea,  focal weakness, or edema.  Lab Results  Component Value Date   CREATININE 0.86 05/15/2020   BUN 17 05/15/2020   NA 139 05/15/2020   K 4.4 05/15/2020   CL 101 05/15/2020   CO2 29 05/15/2020   Recurrent urinary symptoms, urology referral was placed. She has not had urinary symptoms  since last visit, did not received call for urologist. No gross hematuria. UA with microscopic hematuria.  Review of Systems  Constitutional:  Negative for fever.  HENT:  Negative for congestion, rhinorrhea and sore throat.   Eyes:  Negative for pain.  Respiratory:  Negative for cough, shortness of breath and wheezing.   Gastrointestinal:  Negative for diarrhea and vomiting.  Genitourinary:  Negative for decreased urine volume and dysuria.  Skin:  Positive for rash. Negative for nail changes.  Neurological:  Negative for syncope, facial asymmetry and weakness.  Rest see pertinent positives and negatives per HPI.  Current Outpatient Medications on File Prior to Visit  Medication Sig Dispense Refill   Cholecalciferol (VITAMIN D3) 1.25 MG (50000 UT) CAPS Take 1 capsule by mouth once a week. 8 capsule 1   Dexlansoprazole (DEXILANT) 30 MG capsule DR Take 1 capsule (30 mg total) by mouth daily. 90 capsule 2   losartan (COZAAR) 50 MG tablet Take 1 tablet (50 mg total) by mouth daily. 90 tablet 2   Multiple Vitamin (MULTIVITAMIN) tablet Take 1 tablet by mouth.     valACYclovir (VALTREX) 1000 MG tablet Take 2 tablets (2,000 mg total) by mouth at onset of outbreak then repeat in 12 hours 12 tablet 0   No current facility-administered medications on file prior to visit.   Past Medical History:  Diagnosis Date   Cataract 11/2018   GERD (gastroesophageal reflux disease)    Hyperlipidemia 09/07/2019   Hypertension 03/2015   Osteoporosis 09/07/2019  No Known Allergies  Social History   Socioeconomic History   Marital status: Divorced    Spouse name: Not on file   Number of children: Not on file   Years of education: Not on file   Highest education level: Some college, no degree  Occupational History   Not on file  Tobacco Use   Smoking status: Never   Smokeless tobacco: Never  Substance and Sexual Activity   Alcohol use: Yes    Alcohol/week: 0.0 standard drinks of alcohol    Comment:  on weekends - 2 glasses wine.   Drug use: No   Sexual activity: Not on file  Other Topics Concern   Not on file  Social History Narrative   Not on file   Social Determinants of Health   Financial Resource Strain: Low Risk  (08/20/2021)   Overall Financial Resource Strain (CARDIA)    Difficulty of Paying Living Expenses: Not hard at all  Food Insecurity: No Food Insecurity (08/20/2021)   Hunger Vital Sign    Worried About Running Out of Food in the Last Year: Never true    Ran Out of Food in the Last Year: Never true  Transportation Needs: No Transportation Needs (08/20/2021)   PRAPARE - Hydrologist (Medical): No    Lack of Transportation (Non-Medical): No  Physical Activity: Sufficiently Active (08/20/2021)   Exercise Vital Sign    Days of Exercise per Week: 7 days    Minutes of Exercise per Session: 30 min  Stress: No Stress Concern Present (08/20/2021)   [place]    Feeling of Stress : Not at all  Social Connections: Socially Isolated (08/20/2021)   Social Connection and Isolation Panel [NHANES]    Frequency of Communication with Friends and Family: More than three times a week    Frequency of Social Gatherings with Friends and Family: More than three times a week    Attends Religious Services: Never    Marine scientist or Organizations: No    Attends Archivist Meetings: Never    Marital Status: Divorced   Vitals:   08/23/21 0954  BP: 124/80  Pulse: 82  Resp: 16  SpO2: 99%   Body mass index is 25.38 kg/m.  Physical Exam Vitals and nursing note reviewed.  Constitutional:      General: She is not in acute distress.    Appearance: She is well-developed.  HENT:     Head: Normocephalic and atraumatic.     Mouth/Throat:     Mouth: Mucous membranes are moist.     Pharynx: Oropharynx is clear.  Eyes:     Conjunctiva/sclera: Conjunctivae normal.   Cardiovascular:     Rate and Rhythm: Normal rate and regular rhythm.     Pulses:          Dorsalis pedis pulses are 2+ on the right side and 2+ on the left side.     Heart sounds: No murmur heard. Pulmonary:     Effort: Pulmonary effort is normal. No respiratory distress.     Breath sounds: Normal breath sounds.  Abdominal:     Palpations: Abdomen is soft. There is no mass.     Tenderness: There is no abdominal tenderness.  Musculoskeletal:     Lumbar back: Tenderness present. No bony tenderness.       Back:  Lymphadenopathy:     Cervical: No cervical adenopathy.  Skin:  General: Skin is warm.     Findings: Rash present. No erythema. Rash is not pustular or vesicular.     Comments: Right > left forearm, linear pattern confluent papular lesions.   Neurological:     General: No focal deficit present.     Mental Status: She is alert and oriented to person, place, and time.     Gait: Gait normal.   ASSESSMENT AND PLAN:  Ms.Chanah was seen today for poison ivy.  Diagnoses and all orders for this visit: Orders Placed This Encounter  Procedures   Basic metabolic panel   Urine Microscopic Only   Lab Results  Component Value Date   CREATININE 0.74 08/23/2021   BUN 20 08/23/2021   NA 139 08/23/2021   K 4.2 08/23/2021   CL 101 08/23/2021   CO2 30 08/23/2021   Contact dermatitis due to poison ivy Mild. Topical treatment with Clobetasol to continue bid, small amount for up to 2 weeks. Some side effect discussed. F/U as needed.  -     clobetasol cream (TEMOVATE) 0.05 %; Apply topically 2 times daily for 14 days, then as needed.  Hypertension, essential, benign BP adequately controlled. Continue Losartan 50 mg daily and low salt diet. Monitor BP regularly.  Asymptomatic microscopic hematuria No other urinary symptom. Urology referral placed in 05/2021, not longer having UTI symptoms. If recurrent,she can call and arrange appt. Further recommendations according to  microscopic.  Localized osteoporosis without current pathological fracture Continue vit D and Ca++ supplementation. Fall precautions. DEXA 09/2021. Following with ortho.  Vitamin D deficiency, unspecified Complete Ergocalciferol 50,000 U x 8 wks. Following with ortho.  Right-sided low back pain without sciatica, unspecified chronicity We discussed Dx,prognosis,and treatment options. Continue following with ortho.  I spent a total of 32 minutes in both face to face and non face to face activities for this visit on the date of this encounter. During this time history was obtained and documented, examination was performed, prior labs reviewed, and assessment/plan discussed.  Return in about 6 months (around 02/23/2022).   G. Martinique, MD  [facility] [place] office.

## 2021-08-23 ENCOUNTER — Other Ambulatory Visit (HOSPITAL_COMMUNITY): Payer: Self-pay

## 2021-08-23 ENCOUNTER — Ambulatory Visit (INDEPENDENT_AMBULATORY_CARE_PROVIDER_SITE_OTHER): Payer: PPO | Admitting: Family Medicine

## 2021-08-23 ENCOUNTER — Encounter: Payer: Self-pay | Admitting: Family Medicine

## 2021-08-23 VITALS — BP 124/80 | HR 82 | Resp 16 | Ht 66.0 in | Wt 157.2 lb

## 2021-08-23 DIAGNOSIS — M816 Localized osteoporosis [Lequesne]: Secondary | ICD-10-CM

## 2021-08-23 DIAGNOSIS — E559 Vitamin D deficiency, unspecified: Secondary | ICD-10-CM | POA: Diagnosis not present

## 2021-08-23 DIAGNOSIS — M545 Low back pain, unspecified: Secondary | ICD-10-CM | POA: Diagnosis not present

## 2021-08-23 DIAGNOSIS — R3121 Asymptomatic microscopic hematuria: Secondary | ICD-10-CM

## 2021-08-23 DIAGNOSIS — L237 Allergic contact dermatitis due to plants, except food: Secondary | ICD-10-CM | POA: Diagnosis not present

## 2021-08-23 DIAGNOSIS — I10 Essential (primary) hypertension: Secondary | ICD-10-CM | POA: Diagnosis not present

## 2021-08-23 LAB — URINALYSIS, MICROSCOPIC ONLY: RBC / HPF: NONE SEEN (ref 0–?)

## 2021-08-23 LAB — BASIC METABOLIC PANEL
BUN: 20 mg/dL (ref 6–23)
CO2: 30 mEq/L (ref 19–32)
Calcium: 9.8 mg/dL (ref 8.4–10.5)
Chloride: 101 mEq/L (ref 96–112)
Creatinine, Ser: 0.74 mg/dL (ref 0.40–1.20)
GFR: 78.21 mL/min (ref >60.00)
Glucose, Bld: 95 mg/dL (ref 70–99)
Potassium: 4.2 mEq/L (ref 3.5–5.1)
Sodium: 139 mEq/L (ref 135–145)

## 2021-08-23 MED ORDER — CLOBETASOL PROPIONATE 0.05 % EX CREA
1.0000 | TOPICAL_CREAM | Freq: Two times a day (BID) | CUTANEOUS | 1 refills | Status: DC
  Filled 2021-08-23: qty 30, 14d supply, fill #0
  Filled 2022-05-26: qty 30, 14d supply, fill #1

## 2021-08-23 NOTE — Patient Instructions (Addendum)
A few things to remember from today's visit:   Vitamin D deficiency, unspecified  Hypertension, essential, benign - Plan: Basic metabolic panel  Asymptomatic microscopic hematuria - Plan: Urine Microscopic Only  Localized osteoporosis without current pathological fracture  Contact dermatitis due to poison ivy - Plan: clobetasol cream (TEMOVATE) 0.05 %  If you need refills please call your pharmacy. Clobetasol small amount on lesions 2 times daily for up to 2 weeks then as needed.  Do not use My Chart to request refills or for acute issues that need immediate attention.   Please be sure medication list is accurate. If a new problem present, please set up appointment sooner than planned today.  Continue ortho follow up for back pain.

## 2021-08-23 NOTE — Assessment & Plan Note (Signed)
Complete Ergocalciferol 50,000 U x 8 wks. Following with ortho.

## 2021-09-16 DIAGNOSIS — Z1231 Encounter for screening mammogram for malignant neoplasm of breast: Secondary | ICD-10-CM | POA: Diagnosis not present

## 2021-09-16 DIAGNOSIS — M85851 Other specified disorders of bone density and structure, right thigh: Secondary | ICD-10-CM | POA: Diagnosis not present

## 2021-09-16 DIAGNOSIS — M85852 Other specified disorders of bone density and structure, left thigh: Secondary | ICD-10-CM | POA: Diagnosis not present

## 2021-09-16 DIAGNOSIS — Z780 Asymptomatic menopausal state: Secondary | ICD-10-CM | POA: Diagnosis not present

## 2021-09-16 DIAGNOSIS — M810 Age-related osteoporosis without current pathological fracture: Secondary | ICD-10-CM | POA: Diagnosis not present

## 2021-09-16 LAB — HM MAMMOGRAPHY

## 2021-09-16 LAB — HM DEXA SCAN

## 2021-09-18 ENCOUNTER — Encounter: Payer: Self-pay | Admitting: Family Medicine

## 2021-09-18 ENCOUNTER — Other Ambulatory Visit (HOSPITAL_COMMUNITY): Payer: Self-pay

## 2021-09-18 ENCOUNTER — Ambulatory Visit (INDEPENDENT_AMBULATORY_CARE_PROVIDER_SITE_OTHER): Payer: PPO | Admitting: Family Medicine

## 2021-09-18 VITALS — BP 130/80 | HR 70 | Temp 97.4°F | Ht 66.0 in | Wt 158.4 lb

## 2021-09-18 DIAGNOSIS — R300 Dysuria: Secondary | ICD-10-CM

## 2021-09-18 LAB — POC URINALSYSI DIPSTICK (AUTOMATED)
Bilirubin, UA: NEGATIVE
Blood, UA: NEGATIVE
Glucose, UA: NEGATIVE
Ketones, UA: NEGATIVE
Nitrite, UA: POSITIVE
Protein, UA: NEGATIVE
Spec Grav, UA: <=1.005 — AB (ref 1.010–1.025)
Urobilinogen, UA: 1 E.U./dL
pH, UA: 7 (ref 5.0–8.0)

## 2021-09-18 MED ORDER — SULFAMETHOXAZOLE-TRIMETHOPRIM 800-160 MG PO TABS
1.0000 | ORAL_TABLET | Freq: Two times a day (BID) | ORAL | 0 refills | Status: DC
  Filled 2021-09-18: qty 10, 5d supply, fill #0

## 2021-09-18 NOTE — Progress Notes (Signed)
Established Patient Office Visit  Subjective   Patient ID: Karen Mcclain, female    DOB: 1945/05/13  Age: 76 y.o. MRN: [id number]  Chief Complaint  Patient presents with   Dysuria    Patient complains of dysuria, x1 day    HPI   Seen today with recurrent urinary symptoms of burning and frequency of 1 day duration.  She started Azo yesterday.  Denies any flank pain.  No nausea or vomiting.  No fevers or chills.  She states this will be the fourth infection she has had this year.  She had infection back in the winter and was placed on Augmentin but did not tolerate that well.  She is taken Septra DS twice since then and seemed to tolerate that well.  Each time she was treated her symptoms improved.  She had been referred to urology but has still not heard back.  Stays well-hydrated.  No longer sexually active.  Past Medical History:  Diagnosis Date   Cataract 11/2018   GERD (gastroesophageal reflux disease)    Hyperlipidemia 09/07/2019   Hypertension 03/2015   Osteoporosis 09/07/2019   Past Surgical History:  Procedure Laterality Date   TUBAL LIGATION      reports that she has never smoked. She has never used smokeless tobacco. She reports current alcohol use. She reports that she does not use drugs. family history includes Heart disease in her father and mother. No Known Allergies\ Review of Systems  Constitutional:  Negative for chills and fever.  Genitourinary:  Positive for dysuria and frequency. Negative for flank pain and hematuria.      Objective:     BP 130/80 (BP Location: Left Arm, Patient Position: Sitting, Cuff Size: Normal)   Pulse 70   Temp (!) 97.4 F (36.3 C) (Oral)   Ht '5\' 6"'$  (1.676 m)   Wt 158 lb 6.4 oz (71.8 kg)   SpO2 99%   BMI 25.57 kg/m  BP Readings from Last 3 Encounters:  09/18/21 130/80  08/23/21 124/80  03/15/21 126/82   Wt Readings from Last 3 Encounters:  09/18/21 158 lb 6.4 oz (71.8 kg)  08/23/21 157 lb 4 oz (71.3 kg)  05/14/21 160  lb (72.6 kg)      Physical Exam Vitals reviewed.  Constitutional:      Appearance: Normal appearance.  Cardiovascular:     Rate and Rhythm: Normal rate and regular rhythm.  Pulmonary:     Effort: Pulmonary effort is normal.     Breath sounds: Normal breath sounds.  Neurological:     Mental Status: She is alert.      Results for orders placed or performed in visit on 09/18/21  POCT Urinalysis Dipstick (Automated)  Result Value Ref Range   Color, UA Yellow    Clarity, UA clear    Glucose, UA Negative Negative   Bilirubin, UA Negative    Ketones, UA negative    Spec Grav, UA <=1.005 (A) 1.010 - 1.025   Blood, UA negative    pH, UA 7.0 5.0 - 8.0   Protein, UA Negative Negative   Urobilinogen, UA 1.0 0.2 or 1.0 E.U./dL   Nitrite, UA Positive    Leukocytes, UA Moderate (2+) (A) Negative  Results for orders placed or performed in visit on 09/18/21  HM MAMMOGRAPHY  Result Value Ref Range   HM Mammogram 0-4 Bi-Rad 0-4 Bi-Rad, Self Reported Normal  HM DEXA SCAN  Result Value Ref Range   HM Dexa Scan Osteoporosis  The 10-year ASCVD risk score (Arnett DK, et al., 2019) is: 27.4%    Assessment & Plan:   Problem List Items Addressed This Visit   None Visit Diagnoses     Dysuria    -  Primary   Relevant Orders   POCT Urinalysis Dipstick (Automated) (Completed)   Urine Culture     Urine dipstick suggest high likelihood of recurrent UTI.  Urine culture sent.  Increase fluid intake.  Start Septra DS 1 twice daily for 5 days pending culture result.  No follow-ups on file.    Carolann Littler, MD

## 2021-09-18 NOTE — Patient Instructions (Signed)
[facility] [phone]

## 2021-09-19 ENCOUNTER — Encounter: Payer: Self-pay | Admitting: Family Medicine

## 2021-09-21 LAB — URINE CULTURE
MICRO NUMBER:: 13787891
SPECIMEN QUALITY:: ADEQUATE

## 2021-09-23 ENCOUNTER — Other Ambulatory Visit (HOSPITAL_COMMUNITY): Payer: Self-pay

## 2021-09-24 ENCOUNTER — Other Ambulatory Visit (HOSPITAL_COMMUNITY): Payer: Self-pay

## 2021-09-24 MED ORDER — VALACYCLOVIR HCL 1 G PO TABS
ORAL_TABLET | ORAL | 5 refills | Status: DC
  Filled 2021-09-24: qty 4, 1d supply, fill #0
  Filled 2021-10-30: qty 4, 1d supply, fill #1
  Filled 2022-01-10: qty 4, 1d supply, fill #2
  Filled 2022-05-26: qty 4, 1d supply, fill #3

## 2021-09-26 ENCOUNTER — Encounter: Payer: Self-pay | Admitting: Family Medicine

## 2021-10-11 ENCOUNTER — Other Ambulatory Visit (HOSPITAL_BASED_OUTPATIENT_CLINIC_OR_DEPARTMENT_OTHER): Payer: Self-pay

## 2021-10-11 MED ORDER — AREXVY 120 MCG/0.5ML IM SUSR
INTRAMUSCULAR | 0 refills | Status: DC
  Filled 2021-10-11: qty 0.5, 1d supply, fill #0

## 2021-10-15 DIAGNOSIS — N3946 Mixed incontinence: Secondary | ICD-10-CM | POA: Diagnosis not present

## 2021-10-29 ENCOUNTER — Other Ambulatory Visit (HOSPITAL_COMMUNITY): Payer: Self-pay

## 2021-10-30 ENCOUNTER — Other Ambulatory Visit (HOSPITAL_COMMUNITY): Payer: Self-pay

## 2021-10-31 ENCOUNTER — Ambulatory Visit (INDEPENDENT_AMBULATORY_CARE_PROVIDER_SITE_OTHER): Payer: PPO | Admitting: *Deleted

## 2021-10-31 DIAGNOSIS — Z23 Encounter for immunization: Secondary | ICD-10-CM

## 2021-11-05 ENCOUNTER — Other Ambulatory Visit (HOSPITAL_COMMUNITY): Payer: Self-pay

## 2021-11-05 DIAGNOSIS — N132 Hydronephrosis with renal and ureteral calculous obstruction: Secondary | ICD-10-CM | POA: Diagnosis not present

## 2021-11-05 DIAGNOSIS — N3020 Other chronic cystitis without hematuria: Secondary | ICD-10-CM | POA: Diagnosis not present

## 2021-11-05 MED ORDER — CEPHALEXIN 250 MG PO CAPS
250.0000 mg | ORAL_CAPSULE | Freq: Every day | ORAL | 0 refills | Status: DC
  Filled 2021-11-05: qty 90, 90d supply, fill #0

## 2021-11-05 MED ORDER — ESTRADIOL 0.1 MG/GM VA CREA
TOPICAL_CREAM | VAGINAL | 6 refills | Status: DC
  Filled 2021-11-05: qty 42.5, 30d supply, fill #0

## 2021-11-06 ENCOUNTER — Other Ambulatory Visit (HOSPITAL_COMMUNITY): Payer: Self-pay

## 2021-11-08 DIAGNOSIS — L820 Inflamed seborrheic keratosis: Secondary | ICD-10-CM | POA: Diagnosis not present

## 2021-11-08 DIAGNOSIS — L720 Epidermal cyst: Secondary | ICD-10-CM | POA: Diagnosis not present

## 2021-11-27 ENCOUNTER — Other Ambulatory Visit (HOSPITAL_COMMUNITY): Payer: Self-pay

## 2021-11-28 ENCOUNTER — Other Ambulatory Visit (HOSPITAL_COMMUNITY): Payer: Self-pay

## 2022-01-10 ENCOUNTER — Ambulatory Visit (INDEPENDENT_AMBULATORY_CARE_PROVIDER_SITE_OTHER): Payer: PPO | Admitting: Adult Health

## 2022-01-10 ENCOUNTER — Encounter: Payer: Self-pay | Admitting: Adult Health

## 2022-01-10 ENCOUNTER — Other Ambulatory Visit (HOSPITAL_COMMUNITY): Payer: Self-pay

## 2022-01-10 VITALS — BP 102/74 | HR 75 | Temp 97.4°F | Wt 160.4 lb

## 2022-01-10 DIAGNOSIS — R300 Dysuria: Secondary | ICD-10-CM

## 2022-01-10 DIAGNOSIS — M810 Age-related osteoporosis without current pathological fracture: Secondary | ICD-10-CM

## 2022-01-10 LAB — POCT URINALYSIS DIPSTICK
Bilirubin, UA: NEGATIVE
Glucose, UA: NEGATIVE
Ketones, UA: NEGATIVE
Nitrite, UA: NEGATIVE
Protein, UA: NEGATIVE
Spec Grav, UA: <=1.005 — AB (ref 1.010–1.025)
Urobilinogen, UA: 0.2 E.U./dL
pH, UA: 7 (ref 5.0–8.0)

## 2022-01-10 MED ORDER — SULFAMETHOXAZOLE-TRIMETHOPRIM 800-160 MG PO TABS
1.0000 | ORAL_TABLET | Freq: Two times a day (BID) | ORAL | 0 refills | Status: DC
  Filled 2022-01-10: qty 10, 5d supply, fill #0

## 2022-01-10 NOTE — Progress Notes (Signed)
Subjective:    Patient ID: Karen Mcclain, female    DOB: [birth year], 76 y.o.   MRN: [id number]  Urinary Tract Infection    76 year old female who  has a past medical history of Cataract (11/2018), GERD (gastroesophageal reflux disease), Hyperlipidemia (09/07/2019), Hypertension (03/2015), and Osteoporosis (09/07/2019).  She presents to the office today for a chronic issue of UTI. She has been seen by Urology and prescribed Keflex 250 mg daily and estradiol cream. She never started these medications due to readings the side effects. .  Her symptoms started today with pelvic pressure, dysuria, frequency, urgency, and dark urine.   She was last treated in August with Bactrim and did well with this medication.  No fevers or chills    Review of Systems See HPI   Past Medical History:  Diagnosis Date   Cataract 11/2018   GERD (gastroesophageal reflux disease)    Hyperlipidemia 09/07/2019   Hypertension 03/2015   Osteoporosis 09/07/2019    Social History   Socioeconomic History   Marital status: Divorced    Spouse name: Not on file   Number of children: Not on file   Years of education: Not on file   Highest education level: Some college, no degree  Occupational History   Not on file  Tobacco Use   Smoking status: Never   Smokeless tobacco: Never  Substance and Sexual Activity   Alcohol use: Yes    Alcohol/week: 0.0 standard drinks of alcohol    Comment: on weekends - 2 glasses wine.   Drug use: No   Sexual activity: Not on file  Other Topics Concern   Not on file  Social History Narrative   Not on file   Social Determinants of Health   Financial Resource Strain: Low Risk  (08/20/2021)   Overall Financial Resource Strain (CARDIA)    Difficulty of Paying Living Expenses: Not hard at all  Food Insecurity: No Food Insecurity (08/20/2021)   Hunger Vital Sign    Worried About Running Out of Food in the Last Year: Never true    Ran Out of Food in the Last Year: Never true   Transportation Needs: No Transportation Needs (08/20/2021)   PRAPARE - Hydrologist (Medical): No    Lack of Transportation (Non-Medical): No  Physical Activity: Sufficiently Active (08/20/2021)   Exercise Vital Sign    Days of Exercise per Week: 7 days    Minutes of Exercise per Session: 30 min  Stress: No Stress Concern Present (08/20/2021)   [place]    Feeling of Stress : Not at all  Social Connections: Socially Isolated (08/20/2021)   Social Connection and Isolation Panel [NHANES]    Frequency of Communication with Friends and Family: More than three times a week    Frequency of Social Gatherings with Friends and Family: More than three times a week    Attends Religious Services: Never    Marine scientist or Organizations: No    Attends Archivist Meetings: Never    Marital Status: Divorced  Human resources officer Violence: Not At Risk (05/14/2021)   Humiliation, Afraid, Rape, and Kick questionnaire    Fear of Current or Ex-Partner: No    Emotionally Abused: No    Physically Abused: No    Sexually Abused: No    Past Surgical History:  Procedure Laterality Date   TUBAL LIGATION      Family History  Problem Relation Age of Onset   Heart disease Father    Heart disease Mother     No Known Allergies  Current Outpatient Medications on File Prior to Visit  Medication Sig Dispense Refill   Calcium-Magnesium-Vitamin D (CALCIUM 1200+D3 PO)      cephALEXin (KEFLEX) 250 MG capsule Take 1 capsule (250 mg total) by mouth at bedtime. 90 capsule 0   Cholecalciferol (D3 5000) 125 MCG (5000 UT) capsule      clobetasol cream (TEMOVATE) 0.05 % Apply topically 2 times daily for 14 days, then as needed. 30 g 1   Dexlansoprazole (DEXILANT) 30 MG capsule DR Take 1 capsule (30 mg total) by mouth daily. 90 capsule 2   estradiol (ESTRACE VAGINAL) 0.1 MG/GM vaginal cream Apply a fingertip  length amount to the urethra 3 nights per week 45 g 6   losartan (COZAAR) 50 MG tablet Take 1 tablet (50 mg total) by mouth daily. 90 tablet 2   Multiple Vitamin (MULTIVITAMIN) tablet Take 1 tablet by mouth.     RSV vaccine recomb adjuvanted (AREXVY) 120 MCG/0.5ML injection Inject into the muscle. 0.5 mL 0   valACYclovir (VALTREX) 1000 MG tablet Take 2 tablets by mouth twice a day at first sign of symptoms, repeat dose in 12 hours. For fever blisters. 4 tablet 5   No current facility-administered medications on file prior to visit.    BP 102/74   Pulse 75   Temp (!) 97.4 F (36.3 C)   Wt 160 lb 6.4 oz (72.8 kg)   SpO2 97%   BMI 25.89 kg/m       Objective:   Physical Exam Vitals and nursing note reviewed.  Constitutional:      Appearance: Normal appearance.  Cardiovascular:     Rate and Rhythm: Normal rate and regular rhythm.     Pulses: Normal pulses.     Heart sounds: Normal heart sounds.  Pulmonary:     Effort: Pulmonary effort is normal.     Breath sounds: Normal breath sounds.  Abdominal:     General: Abdomen is flat. Bowel sounds are normal.     Palpations: Abdomen is soft.  Musculoskeletal:        General: Normal range of motion.  Skin:    General: Skin is warm and dry.     Capillary Refill: Capillary refill takes less than 2 seconds.  Neurological:     General: No focal deficit present.     Mental Status: She is alert and oriented to person, place, and time.  Psychiatric:        Mood and Affect: Mood normal.        Behavior: Behavior normal.        Thought Content: Thought content normal.        Judgment: Judgment normal.       Assessment & Plan:  1. Dysuria  - POCT urinalysis dipstick + 3 leuks and blood.  - Will treat due to symptoms and chronic nature.  - Advised to start using daily antibiotic and estrogen cream once she is done with these antibiotics.  - Urinalysis w microscopic + reflex cultur - sulfamethoxazole-trimethoprim (BACTRIM DS) 800-160  MG tablet; Take 1 tablet by mouth 2 (two) times daily.  Dispense: 10 tablet; Refill: 0  Dorothyann Peng, NP

## 2022-01-10 NOTE — Addendum Note (Signed)
Addended by: Rosalyn Gess D on: 01/10/2022 02:27 PM   Modules accepted: Orders

## 2022-01-13 LAB — URINE CULTURE
MICRO NUMBER:: 14290229
SPECIMEN QUALITY:: ADEQUATE

## 2022-01-30 ENCOUNTER — Encounter: Payer: Self-pay | Admitting: Family Medicine

## 2022-01-31 NOTE — Progress Notes (Signed)
HPI:  [name] is a 76 y.o. female, who is here today for follow up. She was last seen on 08/23/21 for acute visit. Since her last visit, she has seen urologist for recurrent UTI,she was prescribed Keflex 250 mg daily and Estradiol vaginal  Cream by [facility] after otherwise negative work up.She was afraid of having side effects, so did not start medications right away, did so a couple weeks ago after completing 10 days of Bactrim for another UTI on 01/10/22.   She has seen ortho for lower back pain. States that she had lumbar X ray in 05/2021, which showed DDD and osteoporosis. Vit D deficiency: She is currently taking vitamin D at 5,000 units weekly and calcium at 1,200 units daily, as recommended by an orthopedist. She expresses concerns about calcium intake and inquire about the need of taking it.  She underwent physical therapy at [facility] and continues to perform exercises at home, including walking daily. States that osteoporosis treatment has been discussed, including Actonel, Boniva, Fosamax, and Prolia, but is concerned about side effects and insurance coverage. She has previously taken Fosamax for 2-3 years.  She reports having cataract surgery in March/2023 and had a difficult recovery.  Hypertension: Currently she is on Losartan 50 mg daily. BP readings at home:Not checking. Side effects:None. Negative for unusual or severe headache, visual changes, exertional chest pain, dyspnea,  focal weakness, or edema.  Lab Results  Component Value Date   CREATININE 0.74 08/23/2021   BUN 20 08/23/2021   NA 139 08/23/2021   K 4.2 08/23/2021   CL 101 08/23/2021   CO2 30 08/23/2021    Lab Results  Component Value Date   CHOL 190 09/15/2019   HDL 87 09/15/2019   LDLCALC 83 09/15/2019   TRIG 102 09/15/2019   CHOLHDL 2.2 09/15/2019   Review of Systems  Constitutional:  Negative for activity change, appetite change and fever.  HENT:  Negative for mouth sores  and nosebleeds.   Respiratory:  Negative for cough and wheezing.   Gastrointestinal:  Negative for abdominal pain, nausea and vomiting.       Negative for changes in bowel habits.  Genitourinary:  Negative for decreased urine volume and hematuria.  Neurological:  Negative for syncope and facial asymmetry.  See other pertinent positives and negatives in HPI.  Current Outpatient Medications on File Prior to Visit  Medication Sig Dispense Refill   Calcium-Magnesium-Vitamin D (CALCIUM 1200+D3 PO)      cephALEXin (KEFLEX) 250 MG capsule Take 1 capsule (250 mg total) by mouth at bedtime. 90 capsule 0   Cholecalciferol (D3 5000) 125 MCG (5000 UT) capsule      clobetasol cream (TEMOVATE) 0.05 % Apply topically 2 times daily for 14 days, then as needed. 30 g 1   Dexlansoprazole (DEXILANT) 30 MG capsule DR Take 1 capsule (30 mg total) by mouth daily. 90 capsule 2   estradiol (ESTRACE VAGINAL) 0.1 MG/GM vaginal cream Apply a fingertip length amount to the urethra 3 nights per week 45 g 6   losartan (COZAAR) 50 MG tablet Take 1 tablet (50 mg total) by mouth daily. 90 tablet 2   Multiple Vitamin (MULTIVITAMIN) tablet Take 1 tablet by mouth.     RSV vaccine recomb adjuvanted (AREXVY) 120 MCG/0.5ML injection Inject into the muscle. 0.5 mL 0   sulfamethoxazole-trimethoprim (BACTRIM DS) 800-160 MG tablet Take 1 tablet by mouth 2 (two) times daily. 10 tablet 0   valACYclovir (VALTREX) 1000 MG tablet Take  2 tablets by mouth twice a day at first sign of symptoms, repeat dose in 12 hours. For fever blisters. 4 tablet 5   No current facility-administered medications on file prior to visit.   Past Medical History:  Diagnosis Date   Cataract 11/2018   GERD (gastroesophageal reflux disease)    Hyperlipidemia 09/07/2019   Hypertension 03/2015   Osteoporosis 09/07/2019   No Known Allergies  Social History   Socioeconomic History   Marital status: Divorced    Spouse name: Not on file   Number of children: Not  on file   Years of education: Not on file   Highest education level: Some college, no degree  Occupational History   Not on file  Tobacco Use   Smoking status: Never   Smokeless tobacco: Never  Substance and Sexual Activity   Alcohol use: Yes    Alcohol/week: 0.0 standard drinks of alcohol    Comment: on weekends - 2 glasses wine.   Drug use: No   Sexual activity: Not on file  Other Topics Concern   Not on file  Social History Narrative   Not on file   Social Determinants of Health   Financial Resource Strain: Low Risk  (08/20/2021)   Overall Financial Resource Strain (CARDIA)    Difficulty of Paying Living Expenses: Not hard at all  Food Insecurity: No Food Insecurity (08/20/2021)   Hunger Vital Sign    Worried About Running Out of Food in the Last Year: Never true    Ran Out of Food in the Last Year: Never true  Transportation Needs: No Transportation Needs (08/20/2021)   PRAPARE - Administrator, Civil Service (Medical): No    Lack of Transportation (Non-Medical): No  Physical Activity: Sufficiently Active (08/20/2021)   Exercise Vital Sign    Days of Exercise per Week: 7 days    Minutes of Exercise per Session: 30 min  Stress: No Stress Concern Present (08/20/2021)   [employer] of Occupational Health - Occupational Stress Questionnaire    Feeling of Stress : Not at all  Social Connections: Socially Isolated (08/20/2021)   Social Connection and Isolation Panel [NHANES]    Frequency of Communication with Friends and Family: More than three times a week    Frequency of Social Gatherings with Friends and Family: More than three times a week    Attends Religious Services: Never    Database administrator or Organizations: No    Attends Banker Meetings: Never    Marital Status: Divorced   Today's Vitals   02/04/22 1516  BP: 128/80  Pulse: 77  Resp: 16  Temp: 98 F (36.7 C)  TempSrc: Oral  SpO2: 98%  Weight: 159 lb 8 oz (72.3 kg)   Height: 5\' 6"  (1.676 m)   Body mass index is 25.74 kg/m.  Physical Exam Vitals and nursing note reviewed.  Constitutional:      General: She is not in acute distress.    Appearance: She is well-developed.  HENT:     Head: Normocephalic and atraumatic.     Mouth/Throat:     Mouth: Mucous membranes are moist.     Pharynx: Oropharynx is clear.  Eyes:     Conjunctiva/sclera: Conjunctivae normal.     Pupils: Pupils are equal, round, and reactive to light. Pupils unequal: left one mildly bigger.  Cardiovascular:     Rate and Rhythm: Normal rate and regular rhythm.     Pulses:  Dorsalis pedis pulses are 2+ on the right side and 2+ on the left side.     Heart sounds: No murmur heard. Pulmonary:     Effort: Pulmonary effort is normal. No respiratory distress.     Breath sounds: Normal breath sounds.  Abdominal:     Palpations: Abdomen is soft. There is no hepatomegaly or mass.     Tenderness: There is no abdominal tenderness.  Lymphadenopathy:     Cervical: No cervical adenopathy.  Skin:    General: Skin is warm.     Findings: No erythema or rash.  Neurological:     Mental Status: She is alert and oriented to person, place, and time.     Cranial Nerves: No cranial nerve deficit.     Gait: Gait normal.  Psychiatric:        Mood and Affect: Mood and affect normal.   ASSESSMENT AND PLAN:  Ms. Lynze was seen today for follow-up.  Diagnoses and all orders for this visit:  Orders Placed This Encounter  Procedures   Basic metabolic panel   VITAMIN D 25 Hydroxy (Vit-D Deficiency, Fractures)   Lab Results  Component Value Date   CREATININE 0.67 02/04/2022   BUN 15 02/04/2022   NA 139 02/04/2022   K 4.5 02/04/2022   CL 98 02/04/2022   CO2 32 02/04/2022   Recurrent UTI Asymptomatic at this time. Currently on prophylactic abx treatment, Cephalexin 250 mg daily and vaginal estradiol 3 times per week. Following with urologist.  Hypertension, essential, benign BP  adequately controlled. Continue losartan 50 mg daily. Monitor BP at home. Continue low-salt diet. Eye exam is current.  Osteoporosis Continue fall prevention and regular physical activity, recommend adding weightbearing exercises 3 times per week. Ca++ 1000-12000 mg daily, ideally thought her diet. Continue current dose of vit D. We discussed treatment options, she took Fosamax for about 3 years.We will see if her insurance covers Prolia.   Vitamin D deficiency, unspecified Continue current dose of vit D. Further recommendation according to 25 OH vit D result.  Return in about 6 months (around 08/05/2022).  Hala Narula G. [place], MD  [facility]. [place] office.

## 2022-02-04 ENCOUNTER — Ambulatory Visit (INDEPENDENT_AMBULATORY_CARE_PROVIDER_SITE_OTHER): Payer: PPO | Admitting: Family Medicine

## 2022-02-04 ENCOUNTER — Encounter: Payer: Self-pay | Admitting: Family Medicine

## 2022-02-04 VITALS — BP 128/80 | HR 77 | Temp 98.0°F | Resp 16 | Ht 66.0 in | Wt 159.5 lb

## 2022-02-04 DIAGNOSIS — M816 Localized osteoporosis [Lequesne]: Secondary | ICD-10-CM

## 2022-02-04 DIAGNOSIS — I10 Essential (primary) hypertension: Secondary | ICD-10-CM

## 2022-02-04 DIAGNOSIS — E559 Vitamin D deficiency, unspecified: Secondary | ICD-10-CM

## 2022-02-04 DIAGNOSIS — N390 Urinary tract infection, site not specified: Secondary | ICD-10-CM

## 2022-02-04 NOTE — Patient Instructions (Addendum)
A few things to remember from today's visit:  Hypertension, essential, benign - Plan: Basic metabolic panel, Basic metabolic panel  Vitamin D deficiency, unspecified - Plan: VITAMIN D 25 Hydroxy (Vit-D Deficiency, Fractures), VITAMIN D 25 Hydroxy (Vit-D Deficiency, Fractures)  Localized osteoporosis without current pathological fracture We can see if your insurance covers Prolia. Calcium 1200 mg daily, ideally through your diet. Fall prevention. No changes in your medications.  If you need refills for medications you take chronically, please call your pharmacy. Do not use My Chart to request refills or for acute issues that need immediate attention. If you send a my chart message, it may take a few days to be addressed, specially if I am not in the office.  Please be sure medication list is accurate. If a new problem present, please set up appointment sooner than planned today.

## 2022-02-05 ENCOUNTER — Other Ambulatory Visit (HOSPITAL_BASED_OUTPATIENT_CLINIC_OR_DEPARTMENT_OTHER): Payer: Self-pay

## 2022-02-05 LAB — BASIC METABOLIC PANEL
BUN: 15 mg/dL (ref 6–23)
CO2: 32 mEq/L (ref 19–32)
Calcium: 10.9 mg/dL — ABNORMAL HIGH (ref 8.4–10.5)
Chloride: 98 mEq/L (ref 96–112)
Creatinine, Ser: 0.67 mg/dL (ref 0.40–1.20)
GFR: 84.22 mL/min (ref >60.00)
Glucose, Bld: 90 mg/dL (ref 70–99)
Potassium: 4.5 mEq/L (ref 3.5–5.1)
Sodium: 139 mEq/L (ref 135–145)

## 2022-02-05 LAB — VITAMIN D 25 HYDROXY (VIT D DEFICIENCY, FRACTURES): VITD: 98.66 ng/mL (ref 30.00–100.00)

## 2022-02-05 MED ORDER — COMIRNATY 30 MCG/0.3ML IM SUSY
PREFILLED_SYRINGE | INTRAMUSCULAR | 0 refills | Status: DC
  Filled 2022-02-05: qty 0.3, 1d supply, fill #0

## 2022-02-06 NOTE — Assessment & Plan Note (Signed)
Continue current dose of vit D. Further recommendation according to 25 OH vit D result.

## 2022-02-06 NOTE — Assessment & Plan Note (Signed)
Asymptomatic at this time. Currently on prophylactic abx treatment, Cephalexin 250 mg daily and vaginal estradiol 3 times per week. Following with urologist.

## 2022-02-06 NOTE — Assessment & Plan Note (Signed)
Continue fall prevention and regular physical activity, recommend adding weightbearing exercises 3 times per week. Ca++ 1000-12000 mg daily, ideally thought her diet. Continue current dose of vit D. We discussed treatment options, she took Fosamax for about 3 years.We will see if her insurance covers Prolia.

## 2022-02-06 NOTE — Assessment & Plan Note (Signed)
BP adequately controlled. Continue losartan 50 mg daily. Monitor BP at home. Continue low-salt diet. Eye exam is current.

## 2022-02-18 ENCOUNTER — Other Ambulatory Visit: Payer: Self-pay | Admitting: Family Medicine

## 2022-02-18 ENCOUNTER — Other Ambulatory Visit (HOSPITAL_COMMUNITY): Payer: Self-pay

## 2022-02-18 MED ORDER — LOSARTAN POTASSIUM 50 MG PO TABS
50.0000 mg | ORAL_TABLET | Freq: Every day | ORAL | 2 refills | Status: DC
  Filled 2022-02-18: qty 90, 90d supply, fill #0
  Filled 2022-05-26: qty 90, 90d supply, fill #1

## 2022-02-19 ENCOUNTER — Encounter: Payer: Self-pay | Admitting: Family Medicine

## 2022-03-05 ENCOUNTER — Ambulatory Visit (INDEPENDENT_AMBULATORY_CARE_PROVIDER_SITE_OTHER): Payer: PPO

## 2022-03-05 DIAGNOSIS — M816 Localized osteoporosis [Lequesne]: Secondary | ICD-10-CM | POA: Diagnosis not present

## 2022-03-05 MED ORDER — DENOSUMAB 60 MG/ML ~~LOC~~ SOSY
60.0000 mg | PREFILLED_SYRINGE | Freq: Once | SUBCUTANEOUS | Status: AC
  Administered 2022-03-05: 60 mg via SUBCUTANEOUS

## 2022-03-05 NOTE — Progress Notes (Signed)
Per orders of Dr. Martinique, injection of Prolia given by Rodrigo Ran. Patient tolerated injection well.

## 2022-04-25 ENCOUNTER — Encounter: Payer: Self-pay | Admitting: Family Medicine

## 2022-04-25 ENCOUNTER — Other Ambulatory Visit (HOSPITAL_COMMUNITY): Payer: Self-pay

## 2022-04-25 ENCOUNTER — Ambulatory Visit (INDEPENDENT_AMBULATORY_CARE_PROVIDER_SITE_OTHER): Payer: PPO | Admitting: Family Medicine

## 2022-04-25 VITALS — BP 124/80 | HR 70 | Temp 95.4°F | Wt 165.4 lb

## 2022-04-25 DIAGNOSIS — N390 Urinary tract infection, site not specified: Secondary | ICD-10-CM

## 2022-04-25 DIAGNOSIS — R3915 Urgency of urination: Secondary | ICD-10-CM | POA: Diagnosis not present

## 2022-04-25 LAB — POCT URINALYSIS DIPSTICK
Bilirubin, UA: NEGATIVE
Glucose, UA: NEGATIVE
Ketones, UA: NEGATIVE
Nitrite, UA: NEGATIVE
Protein, UA: NEGATIVE
Spec Grav, UA: <=1.005 — AB (ref 1.010–1.025)
Urobilinogen, UA: 0.2 E.U./dL
pH, UA: 6.5 (ref 5.0–8.0)

## 2022-04-25 MED ORDER — CEPHALEXIN 250 MG PO CAPS: 250.0000 mg | ORAL_CAPSULE | Freq: Every day | ORAL | 1 refills | Status: DC

## 2022-04-25 MED ORDER — CEPHALEXIN 500 MG PO CAPS
500.0000 mg | ORAL_CAPSULE | Freq: Three times a day (TID) | ORAL | 0 refills | Status: DC
  Filled 2022-04-25: qty 21, 7d supply, fill #0

## 2022-04-25 NOTE — Progress Notes (Signed)
   Subjective:    Patient ID: Karen Mcclain, female    DOB: 02-Jul-1945, 77 y.o.   MRN: [id number]  HPI Here for a possible UTI. For the past 2 days she has had urinary pressure and burning. No nausea or fever. She drinks plenty of water. She has had recurring UTI's, and I see her culture from 09-18-21 and again from 01-10-22 she grew Proteus mirabilis that is pan-sensitive except for Nitrofurantoin. One time she was treated with Bactrim and the other time with Keflex. Her symptoms went away each time. She then had a workup with Urology which was unremarkable, and Daine Gravel NP started her on maintenance Keflex at 250 mg daily for 90 days. This worked well, and she just stopped this about a week ago.    Review of Systems  Constitutional: Negative.   Respiratory: Negative.    Cardiovascular: Negative.   Gastrointestinal: Negative.   Genitourinary:  Positive for dysuria, frequency and urgency. Negative for flank pain and hematuria.       Objective:   Physical Exam Constitutional:      Appearance: Normal appearance. She is not ill-appearing.  Cardiovascular:     Rate and Rhythm: Normal rate and regular rhythm.     Pulses: Normal pulses.     Heart sounds: Normal heart sounds.  Pulmonary:     Effort: Pulmonary effort is normal.     Breath sounds: Normal breath sounds.  Abdominal:     Tenderness: There is no right CVA tenderness or left CVA tenderness.  Neurological:     Mental Status: She is alert.           Assessment & Plan:  Recurrent UTI's. I think it likely that this is still due to the Proteus. We will treat the acute infection with Keflex 500 mg TID for 7 days. After that she will resume taking Keflex 250 mg daily. I wrote for a 6 month supply. She will follow up with Dr. Martinique. Alysia Penna, MD

## 2022-04-28 LAB — URINE CULTURE
MICRO NUMBER:: 14729760
SPECIMEN QUALITY:: ADEQUATE

## 2022-05-01 ENCOUNTER — Other Ambulatory Visit (HOSPITAL_COMMUNITY): Payer: Self-pay

## 2022-05-01 MED ORDER — CEPHALEXIN 250 MG PO CAPS
250.0000 mg | ORAL_CAPSULE | Freq: Every day | ORAL | 1 refills | Status: DC
  Filled 2022-05-01: qty 90, 90d supply, fill #0
  Filled 2023-01-23: qty 90, 90d supply, fill #1

## 2022-05-16 ENCOUNTER — Ambulatory Visit (INDEPENDENT_AMBULATORY_CARE_PROVIDER_SITE_OTHER): Payer: PPO

## 2022-05-16 VITALS — Ht 66.0 in | Wt 165.0 lb

## 2022-05-16 DIAGNOSIS — Z0000 Encounter for general adult medical examination without abnormal findings: Secondary | ICD-10-CM | POA: Diagnosis not present

## 2022-05-16 NOTE — Patient Instructions (Addendum)
[name] , Thank you for taking time to come for your Medicare Wellness Visit. I appreciate your ongoing commitment to your health goals. Please review the following plan we discussed and let me know if I can assist you in the future.   These are the goals we discussed:  Goals       Increase physical activity (pt-stated)      Patient Stated (pt-stated)      I would like to be more active.      Weight (lb) < 165 lb (74.8 kg)        This is a list of the screening recommended for you and due dates:  Health Maintenance  Topic Date Due   Flu Shot  09/04/2022   Medicare Annual Wellness Visit  05/16/2023   DTaP/Tdap/Td vaccine (3 - Td or Tdap) 02/03/2029   Pneumonia Vaccine  Completed   DEXA scan (bone density measurement)  Completed   COVID-19 Vaccine  Completed   Hepatitis C Screening: USPSTF Recommendation to screen - Ages 26-79 yo.  Completed   Zoster (Shingles) Vaccine  Completed   HPV Vaccine  Aged Out   Colon Cancer Screening  Discontinued    Advanced directives: Please bring a copy of your health care power of attorney and living will to the office to be added to your chart at your convenience.   Conditions/risks identified: None  Next appointment: Follow up in one year for your annual wellness visit     Preventive Care 65 Years and Older, Female Preventive care refers to lifestyle choices and visits with your health care provider that can promote health and wellness. What does preventive care include? A yearly physical exam. This is also called an annual well check. Dental exams once or twice a year. Routine eye exams. Ask your health care provider how often you should have your eyes checked. Personal lifestyle choices, including: Daily care of your teeth and gums. Regular physical activity. Eating a healthy diet. Avoiding tobacco and drug use. Limiting alcohol use. Practicing safe sex. Taking low-dose aspirin every day. Taking vitamin and mineral  supplements as recommended by your health care provider. What happens during an annual well check? The services and screenings done by your health care provider during your annual well check will depend on your age, overall health, lifestyle risk factors, and family history of disease. Counseling  Your health care provider may ask you questions about your: Alcohol use. Tobacco use. Drug use. Emotional well-being. Home and relationship well-being. Sexual activity. Eating habits. History of falls. Memory and ability to understand (cognition). Work and work Astronomer. Reproductive health. Screening  You may have the following tests or measurements: Height, weight, and BMI. Blood pressure. Lipid and cholesterol levels. These may be checked every 5 years, or more frequently if you are over 68 years old. Skin check. Lung cancer screening. You may have this screening every year starting at age 66 if you have a 30-pack-year history of smoking and currently smoke or have quit within the past 15 years. Fecal occult blood test (FOBT) of the stool. You may have this test every year starting at age 58. Flexible sigmoidoscopy or colonoscopy. You may have a sigmoidoscopy every 5 years or a colonoscopy every 10 years starting at age 22. Hepatitis C blood test. Hepatitis B blood test. Sexually transmitted disease (STD) testing. Diabetes screening. This is done by checking your blood sugar (glucose) after you have not eaten for a while (fasting). You may have this done  every 1-3 years. Bone density scan. This is done to screen for osteoporosis. You may have this done starting at age 86. Mammogram. This may be done every 1-2 years. Talk to your health care provider about how often you should have regular mammograms. Talk with your health care provider about your test results, treatment options, and if necessary, the need for more tests. Vaccines  Your health care provider may recommend certain  vaccines, such as: Influenza vaccine. This is recommended every year. Tetanus, diphtheria, and acellular pertussis (Tdap, Td) vaccine. You may need a Td booster every 10 years. Zoster vaccine. You may need this after age 67. Pneumococcal 13-valent conjugate (PCV13) vaccine. One dose is recommended after age 73. Pneumococcal polysaccharide (PPSV23) vaccine. One dose is recommended after age 60. Talk to your health care provider about which screenings and vaccines you need and how often you need them. This information is not intended to replace advice given to you by your health care provider. Make sure you discuss any questions you have with your health care provider. Document Released: 02/16/2015 Document Revised: 10/10/2015 Document Reviewed: 11/21/2014 Elsevier Interactive Patient Education  2017 [place] Prevention in the Home Falls can cause injuries. They can happen to people of all ages. There are many things you can do to make your home safe and to help prevent falls. What can I do on the outside of my home? Regularly fix the edges of walkways and driveways and fix any cracks. Remove anything that might make you trip as you walk through a door, such as a raised step or threshold. Trim any bushes or trees on the path to your home. Use bright outdoor lighting. Clear any walking paths of anything that might make someone trip, such as rocks or tools. Regularly check to see if handrails are loose or broken. Make sure that both sides of any steps have handrails. Any raised decks and porches should have guardrails on the edges. Have any leaves, snow, or ice cleared regularly. Use sand or salt on walking paths during winter. Clean up any spills in your garage right away. This includes oil or grease spills. What can I do in the bathroom? Use night lights. Install grab bars by the toilet and in the tub and shower. Do not use towel bars as grab bars. Use non-skid mats or decals in  the tub or shower. If you need to sit down in the shower, use a plastic, non-slip stool. Keep the floor dry. Clean up any water that spills on the floor as soon as it happens. Remove soap buildup in the tub or shower regularly. Attach bath mats securely with double-sided non-slip rug tape. Do not have throw rugs and other things on the floor that can make you trip. What can I do in the bedroom? Use night lights. Make sure that you have a light by your bed that is easy to reach. Do not use any sheets or blankets that are too big for your bed. They should not hang down onto the floor. Have a firm chair that has side arms. You can use this for support while you get dressed. Do not have throw rugs and other things on the floor that can make you trip. What can I do in the kitchen? Clean up any spills right away. Avoid walking on wet floors. Keep items that you use a lot in easy-to-reach places. If you need to reach something above you, use a strong step stool that has  a grab bar. Keep electrical cords out of the way. Do not use floor polish or wax that makes floors slippery. If you must use wax, use non-skid floor wax. Do not have throw rugs and other things on the floor that can make you trip. What can I do with my stairs? Do not leave any items on the stairs. Make sure that there are handrails on both sides of the stairs and use them. Fix handrails that are broken or loose. Make sure that handrails are as long as the stairways. Check any carpeting to make sure that it is firmly attached to the stairs. Fix any carpet that is loose or worn. Avoid having throw rugs at the top or bottom of the stairs. If you do have throw rugs, attach them to the floor with carpet tape. Make sure that you have a light switch at the top of the stairs and the bottom of the stairs. If you do not have them, ask someone to add them for you. What else can I do to help prevent falls? Wear shoes that: Do not have high  heels. Have rubber bottoms. Are comfortable and fit you well. Are closed at the toe. Do not wear sandals. If you use a stepladder: Make sure that it is fully opened. Do not climb a closed stepladder. Make sure that both sides of the stepladder are locked into place. Ask someone to hold it for you, if possible. Clearly mark and make sure that you can see: Any grab bars or handrails. First and last steps. Where the edge of each step is. Use tools that help you move around (mobility aids) if they are needed. These include: Canes. Walkers. Scooters. Crutches. Turn on the lights when you go into a dark area. Replace any light bulbs as soon as they burn out. Set up your furniture so you have a clear path. Avoid moving your furniture around. If any of your floors are uneven, fix them. If there are any pets around you, be aware of where they are. Review your medicines with your doctor. Some medicines can make you feel dizzy. This can increase your chance of falling. Ask your doctor what other things that you can do to help prevent falls. This information is not intended to replace advice given to you by your health care provider. Make sure you discuss any questions you have with your health care provider. Document Released: 11/16/2008 Document Revised: 06/28/2015 Document Reviewed: 02/24/2014 Elsevier Interactive Patient Education  2017 [employer].

## 2022-05-16 NOTE — Progress Notes (Signed)
Subjective:   Karen Mcclain is a 77 y.o. female who presents for Medicare Annual (Subsequent) preventive examination.  Review of Systems    Virtual Visit via Telephone Note  I connected with  Karen Mcclain on 05/16/22 at  9:15 AM EDT by telephone and verified that I am speaking with the correct person using two identifiers.  Location: Patient: Home Provider: Office Persons participating in the virtual visit: patient/Nurse Health Advisor   I discussed the limitations, risks, security and privacy concerns of performing an evaluation and management service by telephone and the availability of in person appointments. The patient expressed understanding and agreed to proceed.  Interactive audio and video telecommunications were attempted between this nurse and patient, however failed, due to patient having technical difficulties OR patient did not have access to video capability.  We continued and completed visit with audio only.  Some vital signs may be absent or patient reported.   Tillie Rung, LPN  Cardiac Risk Factors include: advanced age (>11men, >58 women);hypertension     Objective:    Today's Vitals   05/16/22 0917  Weight: 165 lb (74.8 kg)  Height:  (1.676 m)   Body mass index is 26.63 kg/m.     05/16/2022    9:23 AM 06/14/2021    9:25 AM 05/14/2021    9:25 AM 04/17/2020    2:55 PM  Advanced Directives  Does Patient Have a Medical Advance Directive? Yes Yes Yes Yes  Type of Estate agent of [place];Living will Living will;Healthcare Power of [employer] Power of [place];Living will Healthcare Power of [place];Living will  Does patient want to make changes to medical advance directive?  No - Patient declined No - Patient declined No - Patient declined  Copy of Healthcare Power of Attorney in Chart? No - copy requested No - copy requested No - copy requested No - copy requested    Current Medications  (verified) Outpatient Encounter Medications as of 05/16/2022  Medication Sig   cephALEXin (KEFLEX) 250 MG capsule Take 1 capsule (250 mg total) by mouth daily.   cephALEXin (KEFLEX) 500 MG capsule Take 1 capsule (500 mg total) by mouth 3 (three) times daily.   Cholecalciferol (D3 5000) 125 MCG (5000 UT) capsule    clobetasol cream (TEMOVATE) 0.05 % Apply topically 2 times daily for 14 days, then as needed.   COVID-19 mRNA vaccine 2023-2024 (COMIRNATY) syringe Inject into the muscle.   Dexlansoprazole (DEXILANT) 30 MG capsule DR Take 1 capsule (30 mg total) by mouth daily.   estradiol (ESTRACE VAGINAL) 0.1 MG/GM vaginal cream Apply a fingertip length amount to the urethra 3 nights per week   losartan (COZAAR) 50 MG tablet Take 1 tablet (50 mg total) by mouth daily.   Multiple Vitamin (MULTIVITAMIN) tablet Take 1 tablet by mouth.   RSV vaccine recomb adjuvanted (AREXVY) 120 MCG/0.5ML injection Inject into the muscle.   valACYclovir (VALTREX) 1000 MG tablet Take 2 tablets by mouth twice a day at first sign of symptoms, repeat dose in 12 hours. For fever blisters.   [DISCONTINUED] cephALEXin (KEFLEX) 250 MG capsule Take 1 capsule (250 mg total) by mouth daily.   No facility-administered encounter medications on file as of 05/16/2022.    Allergies (verified) Patient has no known allergies.   History: Past Medical History:  Diagnosis Date   Cataract 11/2018   GERD (gastroesophageal reflux disease)    Hyperlipidemia 09/07/2019   Hypertension 03/2015   Osteoporosis 09/07/2019   Past  Surgical History:  Procedure Laterality Date   TUBAL LIGATION     Family History  Problem Relation Age of Onset   Heart disease Father    Heart disease Mother    Social History   Socioeconomic History   Marital status: Divorced    Spouse name: Not on file   Number of children: Not on file   Years of education: Not on file   Highest education level: Some college, no degree  Occupational History   Not on  file  Tobacco Use   Smoking status: Never   Smokeless tobacco: Never  Substance and Sexual Activity   Alcohol use: Yes    Alcohol/week: 0.0 standard drinks of alcohol    Comment: on weekends - 2 glasses wine.   Drug use: No   Sexual activity: Not on file  Other Topics Concern   Not on file  Social History Narrative   Not on file   Social Determinants of Health   Financial Resource Strain: Low Risk  (05/16/2022)   Overall Financial Resource Strain (CARDIA)    Difficulty of Paying Living Expenses: Not hard at all  Food Insecurity: No Food Insecurity (05/16/2022)   Hunger Vital Sign    Worried About Running Out of Food in the Last Year: Never true    Ran Out of Food in the Last Year: Never true  Transportation Needs: No Transportation Needs (05/16/2022)   PRAPARE - Administrator, Civil Service (Medical): No    Lack of Transportation (Non-Medical): No  Physical Activity: Insufficiently Active (05/16/2022)   Exercise Vital Sign    Days of Exercise per Week: 7 days    Minutes of Exercise per Session: 20 min  Stress: No Stress Concern Present (05/16/2022)   [employer] of Occupational Health - Occupational Stress Questionnaire    Feeling of Stress : Not at all  Social Connections: Moderately Integrated (05/16/2022)   Social Connection and Isolation Panel [NHANES]    Frequency of Communication with Friends and Family: More than three times a week    Frequency of Social Gatherings with Friends and Family: More than three times a week    Attends Religious Services: More than 4 times per year    Active Member of [employer] or Organizations: Yes    Attends Engineer, structural: More than 4 times per year    Marital Status: Divorced    Tobacco Counseling Counseling given: Not Answered   Clinical Intake:  Pre-visit preparation completed: Yes  Pain : No/denies pain     BMI - recorded: 26.63 Nutritional Status: BMI 25 -29 Overweight Nutritional Risks:  None Diabetes: No  How often do you need to have someone help you when you read instructions, pamphlets, or other written materials from your doctor or pharmacy?: 1 - Never  Diabetic?  No  Interpreter Needed?: No  Information entered by :: Theresa Mulligan LPN   Activities of Daily Living    05/16/2022    9:22 AM 05/12/2022    9:01 AM  In your present state of health, do you have any difficulty performing the following activities:  Hearing? 0 0  Vision? 0 0  Difficulty concentrating or making decisions? 0 0  Walking or climbing stairs? 0 0  Dressing or bathing? 0 0  Doing errands, shopping? 0 0  Preparing Food and eating ? N N  Using the Toilet? N N  In the past six months, have you accidently leaked urine? N Y  Do you  have problems with loss of bowel control? N N  Managing your Medications? N N  Managing your Finances? N N  Housekeeping or managing your Housekeeping? N N    Patient Care Team: [place], Betty G, MD as PCP - General (Family Medicine) Antony Contras, MD as Consulting Physician (Ophthalmology)  Indicate any recent Medical Services you may have received from other than [facility] providers in the past year (date may be approximate).     Assessment:   This is a routine wellness examination for [place].  Hearing/Vision screen Hearing Screening - Comments:: Denies hearing difficulties   Vision Screening - Comments:: Wears rx glasses - up to date with routine eye exams with  Dr Randon Goldsmith  Dietary issues and exercise activities discussed: Exercise limited by: None identified   Goals Addressed               This Visit's Progress     Increase physical activity (pt-stated)        Depression Screen    05/16/2022    9:22 AM 02/04/2022    3:29 PM 08/23/2021   10:06 AM 05/14/2021    9:20 AM 03/15/2021    9:50 AM 02/28/2021    8:59 AM 04/17/2020    2:56 PM  PHQ 2/9 Scores  PHQ - 2 Score 0 0 0 0 0 0 0  PHQ- 9 Score    0 1      Fall Risk    05/16/2022    9:22 AM  05/12/2022    9:01 AM 02/04/2022    3:29 PM 08/23/2021   10:06 AM 08/20/2021    2:38 PM  Fall Risk   Falls in the past year? 0 0 0 0 0  Number falls in past yr: 0 0 0 0   Injury with Fall? 0 0 0 0   Risk for fall due to : No Fall Risks  Other (Comment) Other (Comment)   Follow up Falls prevention discussed  Falls evaluation completed Falls evaluation completed     FALL RISK PREVENTION PERTAINING TO THE HOME:  Any stairs in or around the home? Yes  If so, are there any without handrails? No  Home free of loose throw rugs in walkways, pet beds, electrical cords, etc? Yes  Adequate lighting in your home to reduce risk of falls? Yes   ASSISTIVE DEVICES UTILIZED TO PREVENT FALLS:  Life alert? No  Use of a cane, walker or w/c? No  Grab bars in the bathroom? No  Shower chair or bench in shower? Yes  Elevated toilet seat or a handicapped toilet? No   TIMED UP AND GO:  Was the test performed? No . Audio Visit   Cognitive Function:        05/16/2022    9:23 AM 05/14/2021    9:25 AM  6CIT Screen  What Year? 0 points 0 points  What month? 0 points 0 points  What time? 0 points 0 points  Count back from 20 0 points 0 points  Months in reverse 0 points 0 points  Repeat phrase 0 points 0 points  Total Score 0 points 0 points    Immunizations Immunization History  Administered Date(s) Administered   COVID-19, mRNA, vaccine(Comirnaty)12 years and older 02/06/2022   Fluad Quad(high Dose 65+) 10/20/2019, 10/25/2020, 10/31/2021   Influenza Split 12/01/2006   Influenza,trivalent, recombinat, inj, PF 12/01/2006   Influenza-Unspecified 11/07/2010, 11/03/2013   PFIZER Comirnaty(Gray Top)Covid-19 Tri-Sucrose Vaccine 06/13/2020   PFIZER(Purple Top)SARS-COV-2 Vaccination 03/14/2019, 04/08/2019, 12/14/2019  Research officer, trade union 3yrs & up 12/06/2020, 08/01/2021   Pneumococcal Conjugate-13 12/04/2013   Pneumococcal Polysaccharide-23 11/30/2008, 03/02/2020   Respiratory  Syncytial Virus Vaccine,Recomb Aduvanted(Arexvy) 10/11/2021   Tdap 04/30/2009, 02/04/2019   Zoster Recombinat (Shingrix) 12/04/2017, 03/10/2018   Zoster, Live 02/04/2008, 10/28/2009    TDAP status: Up to date  Flu Vaccine status: Up to date  Pneumococcal vaccine status: Up to date  Covid-19 vaccine status: Completed vaccines  Qualifies for Shingles Vaccine? Yes   Zostavax completed Yes   Shingrix Completed?: Yes  Screening Tests Health Maintenance  Topic Date Due   INFLUENZA VACCINE  09/04/2022   Medicare Annual Wellness (AWV)  05/16/2023   DTaP/Tdap/Td (3 - Td or Tdap) 02/03/2029   Pneumonia Vaccine 21+ Years old  Completed   DEXA SCAN  Completed   COVID-19 Vaccine  Completed   Hepatitis C Screening  Completed   Zoster Vaccines- Shingrix  Completed   HPV VACCINES  Aged Out   COLONOSCOPY (Pts 45-76yrs Insurance coverage will need to be confirmed)  Discontinued    Health Maintenance  There are no preventive care reminders to display for this patient.   Colorectal cancer screening: No longer required.   Mammogram status: No longer required due to Age.  Bone Density status: Completed 09/16/21. Results reflect: Bone density results: OSTEOPOROSIS. Repeat every   years.  Lung Cancer Screening: (Low Dose CT Chest recommended if Age 72-80 years, 30 pack-year currently smoking OR have quit w/in 15years.) does not qualify.     Additional Screening:  Hepatitis C Screening: does qualify; Completed 10/13/11  Vision Screening: Recommended annual ophthalmology exams for early detection of glaucoma and other disorders of the eye. Is the patient up to date with their annual eye exam?  Yes  Who is the provider or what is the name of the office in which the patient attends annual eye exams? Dr Randon Goldsmith If pt is not established with a provider, would they like to be referred to a provider to establish care? No .   Dental Screening: Recommended annual dental exams for proper oral  hygiene  Community Resource Referral / Chronic Care Management:  CRR required this visit?  No   CCM required this visit?  No      Plan:     I have personally reviewed and noted the following in the patient's chart:   Medical and social history Use of alcohol, tobacco or illicit drugs  Current medications and supplements including opioid prescriptions. Patient is not currently taking opioid prescriptions. Functional ability and status Nutritional status Physical activity Advanced directives List of other physicians Hospitalizations, surgeries, and ER visits in previous 12 months Vitals Screenings to include cognitive, depression, and falls Referrals and appointments  In addition, I have reviewed and discussed with patient certain preventive protocols, quality metrics, and best practice recommendations. A written personalized care plan for preventive services as well as general preventive health recommendations were provided to patient.     Tillie Rung, LPN   [id number]/50/[id number]   Nurse Notes: None

## 2022-06-04 ENCOUNTER — Other Ambulatory Visit (HOSPITAL_COMMUNITY): Payer: Self-pay

## 2022-06-04 DIAGNOSIS — D2372 Other benign neoplasm of skin of left lower limb, including hip: Secondary | ICD-10-CM | POA: Diagnosis not present

## 2022-06-04 DIAGNOSIS — D1801 Hemangioma of skin and subcutaneous tissue: Secondary | ICD-10-CM | POA: Diagnosis not present

## 2022-06-04 DIAGNOSIS — L910 Hypertrophic scar: Secondary | ICD-10-CM | POA: Diagnosis not present

## 2022-06-04 DIAGNOSIS — L820 Inflamed seborrheic keratosis: Secondary | ICD-10-CM | POA: Diagnosis not present

## 2022-06-04 DIAGNOSIS — L814 Other melanin hyperpigmentation: Secondary | ICD-10-CM | POA: Diagnosis not present

## 2022-06-04 DIAGNOSIS — L821 Other seborrheic keratosis: Secondary | ICD-10-CM | POA: Diagnosis not present

## 2022-06-04 DIAGNOSIS — D485 Neoplasm of uncertain behavior of skin: Secondary | ICD-10-CM | POA: Diagnosis not present

## 2022-06-04 DIAGNOSIS — B0089 Other herpesviral infection: Secondary | ICD-10-CM | POA: Diagnosis not present

## 2022-06-04 MED ORDER — VALACYCLOVIR HCL 1 G PO TABS
ORAL_TABLET | ORAL | 5 refills | Status: DC
  Filled 2022-06-04: qty 12, 3d supply, fill #0
  Filled 2022-10-29: qty 12, 3d supply, fill #1
  Filled 2022-11-21: qty 12, 3d supply, fill #2
  Filled 2023-04-27: qty 12, 3d supply, fill #3

## 2022-07-16 ENCOUNTER — Telehealth: Payer: Self-pay | Admitting: Family Medicine

## 2022-07-16 NOTE — Telephone Encounter (Signed)
Pt states she was here on 04/25/22 and saw Dr. Clent Ridges for a UTI.   Pt says she still has a UTI, almost 90 days later.  Pt was offered an OV.   Pt stated she just wants some advice, or maybe a stronger Rx.  Pt is asking what she should do next?  Please advise.

## 2022-07-16 NOTE — Telephone Encounter (Signed)
It is ok to place referral to urologist, other possible etiologies need to be investigated. Thanks, BJ

## 2022-07-16 NOTE — Telephone Encounter (Signed)
I spoke with patient. She will call urology office to schedule an appointment.

## 2022-07-17 DIAGNOSIS — N3020 Other chronic cystitis without hematuria: Secondary | ICD-10-CM | POA: Diagnosis not present

## 2022-07-21 ENCOUNTER — Other Ambulatory Visit (HOSPITAL_COMMUNITY): Payer: Self-pay

## 2022-07-21 ENCOUNTER — Other Ambulatory Visit: Payer: Self-pay

## 2022-07-21 MED ORDER — CEPHALEXIN 250 MG PO CAPS
ORAL_CAPSULE | ORAL | 1 refills | Status: DC
  Filled 2022-07-21 – 2022-10-30 (×2): qty 90, 90d supply, fill #0

## 2022-07-22 ENCOUNTER — Other Ambulatory Visit (HOSPITAL_COMMUNITY): Payer: Self-pay

## 2022-08-04 NOTE — Progress Notes (Unsigned)
HPI: [name] is a 77 y.o. female, who is here today for chronic disease management.  Last seen on 02/04/22. Recurrent urinary tract infections: She is under the care of urologist and is currently on her third series of a 90-day antibiotic treatment, supplemented with an over-the-counter medication because has has symptoms despite of abx treatment. She is also using Premarin cream for vaginal atrophy Currently she is asymptomatic.  Osteoporosis: She started Prolia 5 months ago and did not note any side effects. She is on Vit D supplementation. Hypercalcemia, she is not on OTC calcium supplementation.  Denies any numbness, tingling, muscle fasciculations,abdominal pain, or symptoms of depression.  Vit D def: Last visit dose of vit D was adjusted , currently on 5000 U q 10 days.  Her last eye exam was two years ago, s/p cataract surgery. Hypertension: She is on Losartan 50 mg daily. She does not check her BP regularly. Negative for unusual or severe headache, visual changes, exertional chest pain, dyspnea,  focal weakness, or edema.  Lab Results  Component Value Date   CREATININE 0.67 02/04/2022   BUN 15 02/04/2022   NA 139 02/04/2022   K 4.5 02/04/2022   CL 98 02/04/2022   CO2 32 02/04/2022   Review of Systems  Constitutional:  Negative for chills and fever.  HENT:  Negative for mouth sores and sore throat.   Respiratory:  Negative for cough and wheezing.   Gastrointestinal:  Negative for nausea and vomiting.  Endocrine: Negative for cold intolerance and heat intolerance.  Genitourinary:  Negative for decreased urine volume and hematuria.  Skin:  Negative for rash.  Neurological:  Negative for syncope and facial asymmetry.  Psychiatric/Behavioral:  Negative for confusion. The patient is not nervous/anxious.   See other pertinent positives and negatives in HPI.  Current Outpatient Medications on File Prior to Visit  Medication Sig Dispense Refill    cephALEXin (KEFLEX) 250 MG capsule Take 1 capsule (250 mg total) by mouth daily. 90 capsule 1   cephALEXin (KEFLEX) 250 MG capsule Take 1 capsule by mouth at bedtime 90 capsule 1   Cholecalciferol (D3 5000) 125 MCG (5000 UT) capsule      clobetasol cream (TEMOVATE) 0.05 % Apply topically 2 times daily for 14 days, then as needed. 30 g 1   Dexlansoprazole (DEXILANT) 30 MG capsule DR Take 1 capsule (30 mg total) by mouth daily. 90 capsule 2   estradiol (ESTRACE VAGINAL) 0.1 MG/GM vaginal cream Apply a fingertip length amount to the urethra 3 nights per week 45 g 6   Multiple Vitamin (MULTIVITAMIN) tablet Take 1 tablet by mouth.     valACYclovir (VALTREX) 1000 MG tablet Take 2 tablets by mouth twice a day at first sign of symptoms, repeat dose in 12 hours. For fever blisters. 4 tablet 5   valACYclovir (VALTREX) 1000 MG tablet Take 2 tablets by mouth twice a day at first sign of symptoms, repeat dose in 12 hours. For fever blisters. 12 tablet 5   No current facility-administered medications on file prior to visit.   Past Medical History:  Diagnosis Date   Cataract 11/2018   GERD (gastroesophageal reflux disease)    Hyperlipidemia 09/07/2019   Hypertension 03/2015   Osteoporosis 09/07/2019   No Known Allergies  Social History   Socioeconomic History   Marital status: Divorced    Spouse name: Not on file   Number of children: Not on file   Years of education: Not on file  Highest education level: Some college, no degree  Occupational History   Not on file  Tobacco Use   Smoking status: Never   Smokeless tobacco: Never  Substance and Sexual Activity   Alcohol use: Yes    Alcohol/week: 0.0 standard drinks of alcohol    Comment: on weekends - 2 glasses wine.   Drug use: No   Sexual activity: Not on file  Other Topics Concern   Not on file  Social History Narrative   Not on file   Social Determinants of Health   Financial Resource Strain: Low Risk  (05/16/2022)   Overall Financial  Resource Strain (CARDIA)    Difficulty of Paying Living Expenses: Not hard at all  Food Insecurity: No Food Insecurity (05/16/2022)   Hunger Vital Sign    Worried About Running Out of Food in the Last Year: Never true    Ran Out of Food in the Last Year: Never true  Transportation Needs: No Transportation Needs (05/16/2022)   PRAPARE - Administrator, Civil Service (Medical): No    Lack of Transportation (Non-Medical): No  Physical Activity: Insufficiently Active (05/16/2022)   Exercise Vital Sign    Days of Exercise per Week: 7 days    Minutes of Exercise per Session: 20 min  Stress: No Stress Concern Present (05/16/2022)   [employer] of Occupational Health - Occupational Stress Questionnaire    Feeling of Stress : Not at all  Social Connections: Moderately Integrated (05/16/2022)   Social Connection and Isolation Panel [NHANES]    Frequency of Communication with Friends and Family: More than three times a week    Frequency of Social Gatherings with Friends and Family: More than three times a week    Attends Religious Services: More than 4 times per year    Active Member of Clubs or Organizations: Yes    Attends Banker Meetings: More than 4 times per year    Marital Status: Divorced    Vitals:   08/05/22 1349  BP: 124/80  Pulse: 75  Resp: 16  Temp: 97.8 F (36.6 C)  SpO2: 98%   Body mass index is 26.39 kg/m.  Physical Exam Vitals and nursing note reviewed.  Constitutional:      General: She is not in acute distress.    Appearance: She is well-developed.  HENT:     Head: Normocephalic and atraumatic.     Mouth/Throat:     Mouth: Mucous membranes are moist.     Pharynx: Oropharynx is clear.  Eyes:     Conjunctiva/sclera: Conjunctivae normal.  Cardiovascular:     Rate and Rhythm: Normal rate and regular rhythm.     Pulses:          Dorsalis pedis pulses are 2+ on the right side and 2+ on the left side.     Heart sounds: No murmur  heard. Pulmonary:     Effort: Pulmonary effort is normal. No respiratory distress.     Breath sounds: Normal breath sounds.  Abdominal:     Palpations: Abdomen is soft. There is no hepatomegaly or mass.     Tenderness: There is no abdominal tenderness.  Lymphadenopathy:     Cervical: No cervical adenopathy.  Skin:    General: Skin is warm.     Findings: No erythema or rash.  Neurological:     General: No focal deficit present.     Mental Status: She is alert and oriented to person, place, and time.  Cranial Nerves: No cranial nerve deficit.     Gait: Gait normal.  Psychiatric:        Mood and Affect: Mood and affect normal.   ASSESSMENT AND PLAN:  [name] was seen today for medical management of chronic issues.  Diagnoses and all orders for this visit: Orders Placed This Encounter  Procedures   Parathyroid hormone, intact (no Ca)   VITAMIN D 25 Hydroxy (Vit-D Deficiency, Fractures)   CBC   Comprehensive metabolic panel   Calcium, ionized   Lab Results  Component Value Date   CREATININE 0.85 08/05/2022   BUN 15 08/05/2022   NA 137 08/05/2022   K 4.1 08/05/2022   CL 101 08/05/2022   CO2 29 08/05/2022   Lab Results  Component Value Date   ALT 21 08/05/2022   AST 23 08/05/2022   ALKPHOS 32 (L) 08/05/2022   BILITOT 0.4 08/05/2022   Lab Results  Component Value Date   WBC 5.1 08/05/2022   HGB 13.9 08/05/2022   HCT 41.7 08/05/2022   MCV 94.9 08/05/2022   PLT 316.0 08/05/2022   Lab Results  Component Value Date   PTH 37 08/05/2022   CALCIUM 9.8 08/05/2022   CAION 5.2 08/05/2022   Hypercalcemia Mild. We discussed possible etiologies. Further recommendations according to lab results.  -     Parathyroid hormone, intact (no Ca); Future -     VITAMIN D 25 Hydroxy (Vit-D Deficiency, Fractures); Future -     CBC; Future -     Comprehensive metabolic panel; Future -     Calcium, ionized  Hypertension, essential, benign Assessment & Plan: BP adequately  controlled. Continue losartan 50 mg daily and low-salt diet. Last eye exam about 2 years ago, she is planning on arranging appointment. Recommend monitoring BP at home regularly, 1 or twice per month. If labs are stable and BP at home is adequately controlled, I think it is appropriate to follow annually. Instructed about warning signs.  Orders: -     CBC; Future -     Comprehensive metabolic panel; Future -     Losartan Potassium; Take 1 tablet (50 mg total) by mouth daily.  Dispense: 90 tablet; Refill: 3  Localized osteoporosis without current pathological fracture Assessment & Plan: Started Prolia about 6 months ago, well-tolerated. Continue adequate calcium (1000 mcg through diet) and vitamin D intake. Fall prevention discussed. Last bone density in 09/2019, she has one scheduled for 09/2022.  Recurrent UTI Assessment & Plan: Still having recurrent urinary symptoms, currently on daily Keflex. Planning on following with urologist in about 3 months.   Vitamin D deficiency, unspecified Assessment & Plan: Continue vitamin D 5000 units every 10 days. Further recommendation will be given according to 25 OH vitamin D result.  Return in about 1 year (around 08/05/2023) for CPE, chronic problems, Labs.   G. [place], MD  [facility]. [place] office.

## 2022-08-05 ENCOUNTER — Ambulatory Visit (INDEPENDENT_AMBULATORY_CARE_PROVIDER_SITE_OTHER): Payer: PPO | Admitting: Family Medicine

## 2022-08-05 ENCOUNTER — Encounter: Payer: Self-pay | Admitting: Family Medicine

## 2022-08-05 ENCOUNTER — Other Ambulatory Visit (HOSPITAL_BASED_OUTPATIENT_CLINIC_OR_DEPARTMENT_OTHER): Payer: Self-pay

## 2022-08-05 ENCOUNTER — Other Ambulatory Visit (HOSPITAL_COMMUNITY): Payer: Self-pay

## 2022-08-05 DIAGNOSIS — M816 Localized osteoporosis [Lequesne]: Secondary | ICD-10-CM

## 2022-08-05 DIAGNOSIS — N390 Urinary tract infection, site not specified: Secondary | ICD-10-CM

## 2022-08-05 DIAGNOSIS — I10 Essential (primary) hypertension: Secondary | ICD-10-CM

## 2022-08-05 DIAGNOSIS — E559 Vitamin D deficiency, unspecified: Secondary | ICD-10-CM | POA: Diagnosis not present

## 2022-08-05 LAB — CBC
HCT: 41.7 % (ref 36.0–46.0)
Hemoglobin: 13.9 g/dL (ref 12.0–15.0)
MCHC: 33.5 g/dL (ref 30.0–36.0)
MCV: 94.9 fl (ref 78.0–100.0)
Platelets: 316 10*3/uL (ref 150.0–400.0)
RBC: 4.39 Mil/uL (ref 3.87–5.11)
RDW: 13.5 % (ref 11.5–15.5)
WBC: 5.1 10*3/uL (ref 4.0–10.5)

## 2022-08-05 LAB — COMPREHENSIVE METABOLIC PANEL
ALT: 21 U/L (ref 0–35)
AST: 23 U/L (ref 0–37)
Albumin: 4.2 g/dL (ref 3.5–5.2)
Alkaline Phosphatase: 32 U/L — ABNORMAL LOW (ref 39–117)
BUN: 15 mg/dL (ref 6–23)
CO2: 29 mEq/L (ref 19–32)
Calcium: 9.8 mg/dL (ref 8.4–10.5)
Chloride: 101 mEq/L (ref 96–112)
Creatinine, Ser: 0.85 mg/dL (ref 0.40–1.20)
GFR: 65.79 mL/min (ref >60.00)
Glucose, Bld: 106 mg/dL — ABNORMAL HIGH (ref 70–99)
Potassium: 4.1 mEq/L (ref 3.5–5.1)
Sodium: 137 mEq/L (ref 135–145)
Total Bilirubin: 0.4 mg/dL (ref 0.2–1.2)
Total Protein: 6.8 g/dL (ref 6.0–8.3)

## 2022-08-05 LAB — VITAMIN D 25 HYDROXY (VIT D DEFICIENCY, FRACTURES): VITD: 83.59 ng/mL (ref 30.00–100.00)

## 2022-08-05 MED ORDER — LOSARTAN POTASSIUM 50 MG PO TABS
50.0000 mg | ORAL_TABLET | Freq: Every day | ORAL | 3 refills | Status: DC
Start: 2022-08-05 — End: 2023-07-29
  Filled 2022-08-05: qty 90, 90d supply, fill #0
  Filled 2022-11-21: qty 90, 90d supply, fill #1
  Filled 2023-02-24: qty 90, 90d supply, fill #2
  Filled 2023-04-27 – 2023-05-25 (×2): qty 90, 90d supply, fill #3

## 2022-08-05 NOTE — Assessment & Plan Note (Signed)
Continue vitamin D 5000 units every 10 days. Further recommendation will be given according to 25 OH vitamin D result.

## 2022-08-05 NOTE — Assessment & Plan Note (Signed)
BP adequately controlled. Continue losartan 50 mg daily and low-salt diet. Last eye exam about 2 years ago, she is planning on arranging appointment. Recommend monitoring BP at home regularly, 1 or twice per month. If labs are stable and BP at home is adequately controlled, I think it is appropriate to follow annually. Instructed about warning signs.

## 2022-08-05 NOTE — Patient Instructions (Addendum)
A few things to remember from today's visit:  Hypertension, essential, benign - Plan: CBC, Comprehensive metabolic panel  Hypercalcemia - Plan: Parathyroid hormone, intact (no Ca), VITAMIN D 25 Hydroxy (Vit-D Deficiency, Fractures), CBC, Comprehensive metabolic panel, Calcium, ionized  Localized osteoporosis without current pathological fracture  No changes today. If labs are stable and you check your blood pressure at home 1-2 per months and it is stable, I can continue seeing you annually.  If you need refills for medications you take chronically, please call your pharmacy. Do not use My Chart to request refills or for acute issues that need immediate attention. If you send a my chart message, it may take a few days to be addressed, specially if I am not in the office.  Please be sure medication list is accurate. If a new problem present, please set up appointment sooner than planned today.

## 2022-08-05 NOTE — Assessment & Plan Note (Addendum)
Started Prolia about 6 months ago, well-tolerated. Continue adequate calcium and vitamin D intake. Fall prevention discussed. Last bone density in 09/2019, she has one scheduled for 09/2022.

## 2022-08-05 NOTE — Assessment & Plan Note (Signed)
Still having recurrent urinary symptoms, currently on daily Keflex. Planning on following with urologist in about 3 months.

## 2022-08-06 LAB — CALCIUM, IONIZED: Calcium, Ion: 5.2 mg/dL (ref 4.7–5.5)

## 2022-08-06 LAB — PARATHYROID HORMONE, INTACT (NO CA): PTH: 37 pg/mL (ref 16–77)

## 2022-08-08 ENCOUNTER — Encounter: Payer: Self-pay | Admitting: Family Medicine

## 2022-08-15 ENCOUNTER — Encounter: Payer: Self-pay | Admitting: Family Medicine

## 2022-09-03 ENCOUNTER — Ambulatory Visit (INDEPENDENT_AMBULATORY_CARE_PROVIDER_SITE_OTHER): Payer: PPO

## 2022-09-03 DIAGNOSIS — M816 Localized osteoporosis [Lequesne]: Secondary | ICD-10-CM

## 2022-09-03 MED ORDER — DENOSUMAB 60 MG/ML ~~LOC~~ SOSY
60.0000 mg | PREFILLED_SYRINGE | Freq: Once | SUBCUTANEOUS | Status: AC
Start: 2022-09-03 — End: 2022-09-03
  Administered 2022-09-03: 60 mg via SUBCUTANEOUS

## 2022-09-03 NOTE — Progress Notes (Signed)
Per orders of Dr. Jordan, injection of Prolia given by [facility]. Patient tolerated injection well.  

## 2022-10-13 ENCOUNTER — Other Ambulatory Visit (HOSPITAL_BASED_OUTPATIENT_CLINIC_OR_DEPARTMENT_OTHER): Payer: Self-pay

## 2022-10-14 ENCOUNTER — Other Ambulatory Visit (HOSPITAL_BASED_OUTPATIENT_CLINIC_OR_DEPARTMENT_OTHER): Payer: Self-pay

## 2022-10-14 MED ORDER — COMIRNATY 30 MCG/0.3ML IM SUSY
PREFILLED_SYRINGE | INTRAMUSCULAR | 0 refills | Status: DC
  Filled 2022-10-14: qty 0.3, 1d supply, fill #0

## 2022-10-28 ENCOUNTER — Other Ambulatory Visit (HOSPITAL_COMMUNITY): Payer: Self-pay

## 2022-10-28 DIAGNOSIS — N3946 Mixed incontinence: Secondary | ICD-10-CM | POA: Diagnosis not present

## 2022-10-28 DIAGNOSIS — N3020 Other chronic cystitis without hematuria: Secondary | ICD-10-CM | POA: Diagnosis not present

## 2022-10-28 MED ORDER — CEPHALEXIN 250 MG PO CAPS
250.0000 mg | ORAL_CAPSULE | Freq: Every evening | ORAL | 3 refills | Status: DC
  Filled 2022-10-28 – 2023-04-27 (×2): qty 90, 90d supply, fill #0
  Filled 2023-07-27: qty 90, 90d supply, fill #1
  Filled 2023-10-26: qty 90, 90d supply, fill #2

## 2022-10-28 MED ORDER — ESTRADIOL 0.1 MG/GM VA CREA
TOPICAL_CREAM | VAGINAL | 6 refills | Status: AC
  Filled 2022-10-28: qty 42.5, 90d supply, fill #0

## 2022-10-29 ENCOUNTER — Other Ambulatory Visit (HOSPITAL_COMMUNITY): Payer: Self-pay

## 2022-10-30 ENCOUNTER — Other Ambulatory Visit (HOSPITAL_COMMUNITY): Payer: Self-pay

## 2022-10-30 ENCOUNTER — Other Ambulatory Visit (HOSPITAL_BASED_OUTPATIENT_CLINIC_OR_DEPARTMENT_OTHER): Payer: Self-pay

## 2022-10-30 MED ORDER — FLUAD 0.5 ML IM SUSY
0.5000 mL | PREFILLED_SYRINGE | Freq: Once | INTRAMUSCULAR | 0 refills | Status: AC
  Filled 2022-10-30: qty 0.5, 1d supply, fill #0

## 2022-11-25 ENCOUNTER — Other Ambulatory Visit (HOSPITAL_COMMUNITY): Payer: Self-pay

## 2022-12-09 DIAGNOSIS — H26492 Other secondary cataract, left eye: Secondary | ICD-10-CM | POA: Diagnosis not present

## 2022-12-09 DIAGNOSIS — H2513 Age-related nuclear cataract, bilateral: Secondary | ICD-10-CM | POA: Diagnosis not present

## 2023-01-05 ENCOUNTER — Other Ambulatory Visit: Payer: Self-pay | Admitting: Family

## 2023-01-05 ENCOUNTER — Other Ambulatory Visit (HOSPITAL_COMMUNITY): Payer: Self-pay

## 2023-01-06 ENCOUNTER — Other Ambulatory Visit (HOSPITAL_COMMUNITY): Payer: Self-pay

## 2023-01-06 MED ORDER — PREDNISONE 50 MG PO TABS
50.0000 mg | ORAL_TABLET | Freq: Every day | ORAL | 0 refills | Status: DC
  Filled 2023-01-06: qty 5, 5d supply, fill #0

## 2023-01-06 MED ORDER — METHOCARBAMOL 500 MG PO TABS
500.0000 mg | ORAL_TABLET | Freq: Four times a day (QID) | ORAL | 0 refills | Status: DC | PRN
  Filled 2023-01-06: qty 30, 8d supply, fill #0

## 2023-01-09 ENCOUNTER — Encounter: Payer: Self-pay | Admitting: Family

## 2023-01-09 ENCOUNTER — Other Ambulatory Visit (HOSPITAL_COMMUNITY): Payer: Self-pay

## 2023-01-09 ENCOUNTER — Ambulatory Visit: Payer: PPO | Admitting: Family

## 2023-01-09 DIAGNOSIS — S39012A Strain of muscle, fascia and tendon of lower back, initial encounter: Secondary | ICD-10-CM

## 2023-01-09 DIAGNOSIS — M545 Low back pain, unspecified: Secondary | ICD-10-CM

## 2023-01-09 MED ORDER — METHOCARBAMOL 500 MG PO TABS
500.0000 mg | ORAL_TABLET | Freq: Four times a day (QID) | ORAL | 1 refills | Status: DC | PRN
  Filled 2023-01-09 – 2023-01-12 (×2): qty 30, 8d supply, fill #0
  Filled 2023-05-15: qty 30, 8d supply, fill #1

## 2023-01-09 MED ORDER — PREDNISONE 10 MG PO TABS
10.0000 mg | ORAL_TABLET | Freq: Every day | ORAL | 1 refills | Status: DC
  Filled 2023-01-09: qty 30, 30d supply, fill #0
  Filled 2023-05-15: qty 30, 30d supply, fill #1

## 2023-01-09 NOTE — Progress Notes (Signed)
Office Visit Note   Patient: Karen Mcclain           Date of Birth: September 27, 1945           MRN: [id number] Visit Date: 01/09/2023              Requested by: [place], Betty G, MD [address] [place],  [place] [address] PCP: [place], Betty G, MD  Chief Complaint  Patient presents with   Lower Back - Follow-up      HPI: The patient is a 77 year old woman who presents today for evaluation of low back pain this is right-sided.  Unfortunately was lifting a case of water bottles several times the day before Thanksgiving and subsequently began having extreme right sided low back pain she is able to get comfortable to rest in her recliner or lying flat in bed she had did not feel or hear any pops.  She has been having spasm type pain and significant back pain unable to perform her activities of daily living.  She was called in prednisone and Robaxin which she has been taking for the last 3 days she voices some relief with these she continues to favor and lean off to the right side due to pain  No numbness tingling shooting or burning pain no weakness  Assessment & Plan: Visit Diagnoses: No diagnosis found.  Plan: Lumbar strain on the right.  She will continue with her current treatment.  Discussed using her back stretching exercises in the future  Follow-Up Instructions: Return if symptoms worsen or fail to improve.   Back Exam   Tenderness  Back tenderness location: Soft tissue right lumbar.  Range of Motion  The patient has normal back ROM.  Muscle Strength  The patient has normal back strength.      Patient is alert, oriented, no adenopathy, well-dressed, normal affect, normal respiratory effort.  Imaging: No results found. No images are attached to the encounter.  Labs: No results found for: "HGBA1C", "ESRSEDRATE", "CRP", "LABURIC", "REPTSTATUS", "GRAMSTAIN", "CULT", "LABORGA"   Lab Results  Component Value Date   ALBUMIN 4.2 08/05/2022    No results  found for: "MG" Lab Results  Component Value Date   VD25OH 83.59 08/05/2022   VD25OH 98.66 02/04/2022   VD25OH 63 09/15/2019    No results found for: "PREALBUMIN"    Latest Ref Rng & Units 08/05/2022    2:27 PM  CBC EXTENDED  WBC 4.0 - 10.5 K/uL 5.1   RBC 3.87 - 5.11 Mil/uL 4.39   Hemoglobin 12.0 - 15.0 g/dL 13.2   HCT 44.0 - 10.2 % 41.7   Platelets 150.0 - 400.0 K/uL 316.0      There is no height or weight on file to calculate BMI.  Orders:  No orders of the defined types were placed in this encounter.  No orders of the defined types were placed in this encounter.    Procedures: No procedures performed  Clinical Data: No additional findings.  ROS:  All other systems negative, except as noted in the HPI. Review of Systems  Objective: Vital Signs: There were no vitals taken for this visit.  Specialty Comments:  No specialty comments available.  PMFS History: Patient Active Problem List   Diagnosis Date Noted   Recurrent UTI 02/04/2022   GERD (gastroesophageal reflux disease) 05/15/2020   Hot flashes, menopausal 05/15/2020   Hypertension, essential, benign 09/07/2019   Vitamin D deficiency, unspecified 09/07/2019   Osteoporosis 09/07/2019   Upper airway cough  syndrome 09/14/2014   Past Medical History:  Diagnosis Date   Cataract 11/2018   GERD (gastroesophageal reflux disease)    Hyperlipidemia 09/07/2019   Hypertension 03/2015   Osteoporosis 09/07/2019    Family History  Problem Relation Age of Onset   Heart disease Father    Heart disease Mother     Past Surgical History:  Procedure Laterality Date   TUBAL LIGATION     Social History   Occupational History   Not on file  Tobacco Use   Smoking status: Never   Smokeless tobacco: Never  Substance and Sexual Activity   Alcohol use: Yes    Alcohol/week: 0.0 standard drinks of alcohol    Comment: on weekends - 2 glasses wine.   Drug use: No   Sexual activity: Not on file

## 2023-01-12 ENCOUNTER — Other Ambulatory Visit (HOSPITAL_COMMUNITY): Payer: Self-pay

## 2023-01-13 ENCOUNTER — Other Ambulatory Visit (HOSPITAL_COMMUNITY): Payer: Self-pay

## 2023-02-24 ENCOUNTER — Other Ambulatory Visit (HOSPITAL_COMMUNITY): Payer: Self-pay

## 2023-02-24 ENCOUNTER — Encounter: Payer: Self-pay | Admitting: Family Medicine

## 2023-02-24 ENCOUNTER — Telehealth: Payer: Self-pay

## 2023-02-24 DIAGNOSIS — H353131 Nonexudative age-related macular degeneration, bilateral, early dry stage: Secondary | ICD-10-CM | POA: Diagnosis not present

## 2023-02-24 DIAGNOSIS — H2511 Age-related nuclear cataract, right eye: Secondary | ICD-10-CM | POA: Diagnosis not present

## 2023-02-24 DIAGNOSIS — H26492 Other secondary cataract, left eye: Secondary | ICD-10-CM | POA: Diagnosis not present

## 2023-02-24 DIAGNOSIS — H18513 Endothelial corneal dystrophy, bilateral: Secondary | ICD-10-CM | POA: Diagnosis not present

## 2023-02-24 MED ORDER — PREDNISOLONE ACETATE 1 % OP SUSP
OPHTHALMIC | 0 refills | Status: DC
  Filled 2023-02-24: qty 5, 5d supply, fill #0

## 2023-02-24 NOTE — Telephone Encounter (Signed)
Pt ready for scheduling 03/06/23  Estimated out-of-pocket cost due at time of visit: $315  Primary Insurance: [employer] Prolia co-insurance: 20% (approximately $315)  This summary of benefits is an estimation of the patient's out-of-pocket cost. Exact cost may vary based on individual plan coverage.

## 2023-03-06 ENCOUNTER — Ambulatory Visit (INDEPENDENT_AMBULATORY_CARE_PROVIDER_SITE_OTHER): Payer: PPO

## 2023-03-06 ENCOUNTER — Ambulatory Visit: Payer: PPO | Admitting: Family Medicine

## 2023-03-06 DIAGNOSIS — M816 Localized osteoporosis [Lequesne]: Secondary | ICD-10-CM

## 2023-03-06 MED ORDER — DENOSUMAB 60 MG/ML ~~LOC~~ SOSY
60.0000 mg | PREFILLED_SYRINGE | Freq: Once | SUBCUTANEOUS | Status: AC
  Administered 2023-03-06: 60 mg via SUBCUTANEOUS

## 2023-03-06 NOTE — Progress Notes (Signed)
Per orders of Dr. [place], injection of Prolia given by [facility] on Left Arm. Patient tolerated injection well.

## 2023-04-27 ENCOUNTER — Other Ambulatory Visit (HOSPITAL_COMMUNITY): Payer: Self-pay

## 2023-04-28 ENCOUNTER — Other Ambulatory Visit (HOSPITAL_COMMUNITY): Payer: Self-pay

## 2023-04-28 ENCOUNTER — Other Ambulatory Visit: Payer: Self-pay

## 2023-04-29 ENCOUNTER — Other Ambulatory Visit (HOSPITAL_BASED_OUTPATIENT_CLINIC_OR_DEPARTMENT_OTHER): Payer: Self-pay

## 2023-04-29 ENCOUNTER — Other Ambulatory Visit (HOSPITAL_COMMUNITY): Payer: Self-pay

## 2023-04-29 MED ORDER — COMIRNATY 30 MCG/0.3ML IM SUSY
0.3000 mL | PREFILLED_SYRINGE | Freq: Once | INTRAMUSCULAR | 0 refills | Status: AC
  Filled 2023-04-29: qty 0.3, 1d supply, fill #0

## 2023-05-20 ENCOUNTER — Encounter: Payer: Self-pay | Admitting: Family Medicine

## 2023-05-20 DIAGNOSIS — E2839 Other primary ovarian failure: Secondary | ICD-10-CM

## 2023-06-04 DIAGNOSIS — L821 Other seborrheic keratosis: Secondary | ICD-10-CM | POA: Diagnosis not present

## 2023-06-04 DIAGNOSIS — D1801 Hemangioma of skin and subcutaneous tissue: Secondary | ICD-10-CM | POA: Diagnosis not present

## 2023-06-04 DIAGNOSIS — D2362 Other benign neoplasm of skin of left upper limb, including shoulder: Secondary | ICD-10-CM | POA: Diagnosis not present

## 2023-06-04 DIAGNOSIS — L820 Inflamed seborrheic keratosis: Secondary | ICD-10-CM | POA: Diagnosis not present

## 2023-06-04 DIAGNOSIS — D2372 Other benign neoplasm of skin of left lower limb, including hip: Secondary | ICD-10-CM | POA: Diagnosis not present

## 2023-06-04 DIAGNOSIS — L814 Other melanin hyperpigmentation: Secondary | ICD-10-CM | POA: Diagnosis not present

## 2023-06-04 DIAGNOSIS — L570 Actinic keratosis: Secondary | ICD-10-CM | POA: Diagnosis not present

## 2023-07-22 NOTE — Progress Notes (Signed)
 HPI: Karen Mcclain is a 77 y.o. female with a PMHx significant for  Hypertension; Vitamin-D Deficiency; Osteoporosis; GERD (Gastroesophageal Reflux Disease); Recurrent UTI, who is here today for chronic disease management and due to AWV, she agrees to also do it today.  Last seen on 08/05/2022.  Hypertension: Managed with Losartan  50 mg daily.  Reportedly not checking BPs at home.  Negative for severe/frequent headache, visual changes, chest pain, dyspnea, palpitation, focal weakness, or edema.  BP Readings from Last 3 Encounters:  07/29/23 122/80  08/05/22 124/80  04/25/22 124/80   Lab Results  Component Value Date   NA 137 08/05/2022   CL 101 08/05/2022   K 4.1 08/05/2022   CO2 29 08/05/2022   BUN 15 08/05/2022   CREATININE 0.85 08/05/2022   GFR 65.79 08/05/2022   CALCIUM 9.8 08/05/2022   ALBUMIN 4.2 08/05/2022   GLUCOSE 106 (H) 08/05/2022   Chronic lower back pain: Follows with ortho. Takes Prednisone , apparently PRN.   Osteoporosis Last Bone Density 09/16/2021 with annual follow-up.  Receives Prolia  60 mg injection every 6 months, and also takes Vitamin-D 5000 units weekly for Vitamin-D Deficiency.   Recurrent UTIs Followed by [facility], who she last saw in September 2024.   Hyperlipidemia: Currently she is on nonpharmacologic treatment.  Tremor Says that she has noticed a constant bilateral tremor in both hands since the beginning of this year - not significant when eating, writing, but more at rest.  Denies known Fhx of Parkinson's Disease or Tremor, also denies difficulty swallowing or walking.  Notes she does drink 8 oz of wine daily (2 glasses daily).  Last AWV on 05/12/22.  She lives by herself. Independent ADL's and IADL's. 0 falls in the past year and denies depression symptoms.  Functional Status Survey:    07/29/2023    5:41 PM 05/16/2022    9:22 AM 05/12/2022    9:01 AM 02/04/2022    3:29 PM 08/23/2021   10:06 AM  Fall Risk   Falls in  the past year? 0 0 0 0 0  Number falls in past yr: 0 0 0 0 0  Injury with Fall? 0 0 0 0 0  Risk for fall due to : Orthopedic patient No Fall Risks  Other (Comment) Other (Comment)  Follow up Education provided;Falls evaluation completed Falls prevention discussed  Falls evaluation completed  Falls evaluation completed      Data saved with a previous flowsheet row definition   Providers she sees regularly: Dermatology - [facility], Dr. Lynnell Urology - [facility], Dr. Gaston Eye care provider: Dr. Raelyn S[facility].  Exercise: walks daily 20 minutes Diet: healthy overall Sleep: 6-7 hours  Endorses having a Living Will/ POA     07/29/2023   11:07 AM  Depression screen PHQ 2/9  Decreased Interest 0  Down, Depressed, Hopeless 0  PHQ - 2 Score 0    Mini-Cog - 07/29/23 1135     Normal clock drawing test? yes    How many words correct? 3         Vision Screening   Right eye Left eye Both eyes  Without correction 20/20 20/25 20/20   With correction      Review of Systems  Constitutional:  Negative for activity change, appetite change and fever.  HENT:  Negative for sore throat and trouble swallowing.   Respiratory:  Negative for cough and wheezing.   Gastrointestinal:  Negative for abdominal pain, nausea and vomiting.  Endocrine: Negative  for cold intolerance and heat intolerance.  Genitourinary:  Negative for decreased urine volume, dysuria and hematuria.  Musculoskeletal:  Positive for arthralgias. Negative for gait problem.  Skin:  Negative for rash.  Neurological:  Negative for syncope and facial asymmetry.  Psychiatric/Behavioral:  Negative for confusion and hallucinations.   See other pertinent positives and negatives in HPI.  Current Outpatient Medications on File Prior to Visit  Medication Sig Dispense Refill   cephALEXin  (KEFLEX ) 250 MG capsule Take 1 capsule (250 mg total) by mouth at bedtime. 90 capsule 3   clobetasol  cream  (TEMOVATE ) 0.05 % Apply topically 2 times daily for 14 days, then as needed. 30 g 1   COVID-19 mRNA vaccine 2024-2025 (COMIRNATY ) syringe Inject into the muscle. 0.3 mL 0   Dexlansoprazole  (DEXILANT ) 30 MG capsule DR Take 1 capsule (30 mg total) by mouth daily. 90 capsule 2   estradiol  (ESTRACE  VAGINAL) 0.1 MG/GM vaginal cream Apply a fingertip length amount to the urethra 3 nights per week 45 g 6   methocarbamol  (ROBAXIN ) 500 MG tablet Take 1 tablet (500 mg total) by mouth every 6 (six) hours as needed for muscle spasms. 30 tablet 1   Multiple Vitamin (MULTIVITAMIN) tablet Take 1 tablet by mouth.     prednisoLONE  acetate (PRED FORTE ) 1 % ophthalmic suspension Instill 1 drop into affected eye four times a day for 5 days then stop 5 mL 0   predniSONE  (DELTASONE ) 10 MG tablet Take 1 tablet (10 mg total) by mouth daily with breakfast. 30 tablet 1   valACYclovir  (VALTREX ) 1000 MG tablet Take 2 tablets by mouth twice a day at first sign of symptoms, repeat dose in 12 hours. For fever blisters. 4 tablet 5   valACYclovir  (VALTREX ) 1000 MG tablet Take 2 tablets by mouth twice a day at first sign of symptoms, repeat dose in 12 hours. For fever blisters. 12 tablet 10   No current facility-administered medications on file prior to visit.    Past Medical History:  Diagnosis Date   Cataract 11/2018   GERD (gastroesophageal reflux disease)    Hyperlipidemia 09/07/2019   Hypertension 03/2015   Medicare annual wellness visit, subsequent 07/29/2023   Osteoporosis 09/07/2019   No Known Allergies  Social History   Socioeconomic History   Marital status: Divorced    Spouse name: Not on file   Number of children: Not on file   Years of education: Not on file   Highest education level: Some college, no degree  Occupational History   Not on file  Tobacco Use   Smoking status: Never   Smokeless tobacco: Never  Substance and Sexual Activity   Alcohol  use: Yes    Alcohol /week: 0.0 standard drinks of alcohol      Comment: on weekends - 2 glasses wine.   Drug use: No   Sexual activity: Not on file  Other Topics Concern   Not on file  Social History Narrative   Not on file   Social Drivers of Health   Financial Resource Strain: Low Risk  (07/25/2023)   Overall Financial Resource Strain (CARDIA)    Difficulty of Paying Living Expenses: Not hard at all  Food Insecurity: No Food Insecurity (07/25/2023)   Hunger Vital Sign    Worried About Running Out of Food in the Last Year: Never true    Ran Out of Food in the Last Year: Never true  Transportation Needs: No Transportation Needs (07/25/2023)   PRAPARE - Transportation    Lack of Transportation (  Medical): No    Lack of Transportation (Non-Medical): No  Physical Activity: Insufficiently Active (07/25/2023)   Exercise Vital Sign    Days of Exercise per Week: 6 days    Minutes of Exercise per Session: 20 min  Stress: No Stress Concern Present (07/25/2023)   [employer] of Occupational Health - Occupational Stress Questionnaire    Feeling of Stress: Only a little  Social Connections: Unknown (07/25/2023)   Social Connection and Isolation Panel    Frequency of Communication with Friends and Family: More than three times a week    Frequency of Social Gatherings with Friends and Family: More than three times a week    Attends Religious Services: Never    Database administrator or Organizations: Patient declined    Attends Banker Meetings: Not on file    Marital Status: Divorced    Vitals:   07/29/23 1106  BP: 122/80  Pulse: 82  Resp: 16  Temp: 98.1 F (36.7 C)  SpO2: 97%   Body mass index is 26.97 kg/m.  Physical Exam Vitals and nursing note reviewed.  Constitutional:      General: She is not in acute distress.    Appearance: She is well-developed.  HENT:     Head: Normocephalic and atraumatic.     Mouth/Throat:     Mouth: Mucous membranes are moist.     Pharynx: Oropharynx is clear.   Eyes:      Conjunctiva/sclera: Conjunctivae normal.    Cardiovascular:     Rate and Rhythm: Normal rate and regular rhythm.     Pulses:          Dorsalis pedis pulses are 2+ on the right side and 2+ on the left side.     Heart sounds: No murmur heard. Pulmonary:     Effort: Pulmonary effort is normal. No respiratory distress.     Breath sounds: Normal breath sounds.  Abdominal:     Palpations: Abdomen is soft. There is no hepatomegaly or mass.     Tenderness: There is no abdominal tenderness.  Lymphadenopathy:     Cervical: No cervical adenopathy.   Skin:    General: Skin is warm.     Findings: No erythema or rash.   Neurological:     General: No focal deficit present.     Mental Status: She is alert and oriented to person, place, and time.     Cranial Nerves: No cranial nerve deficit.     Motor: Tremor (noted at rest, L>R) present. No pronator drift.     Coordination: Finger-Nose-Finger Test abnormal (not at target a few times).     Comments: Stable, normal gait not assisted.  Psychiatric:        Mood and Affect: Mood and affect normal.    ASSESSMENT AND PLAN: Karen Mcclain was seen today for chronic disease management.  Orders Placed This Encounter  Procedures   Comprehensive metabolic panel with GFR   Lipid panel   VITAMIN D  25 Hydroxy (Vit-D Deficiency, Fractures)   CBC   TSH   Vitamin B12   Ambulatory referral to Neurology   Lab Results  Component Value Date   VITAMINB12 997 (H) 07/29/2023   Lab Results  Component Value Date   TSH 1.65 07/29/2023   Lab Results  Component Value Date   NA 136 07/29/2023   CL 98 07/29/2023   K 3.9 07/29/2023   CO2 30 07/29/2023   BUN 17 07/29/2023   CREATININE 0.79 07/29/2023  GFR 71.33 07/29/2023   CALCIUM 9.5 07/29/2023   ALBUMIN 4.2 07/29/2023   GLUCOSE 96 07/29/2023   Lab Results  Component Value Date   ALT 17 07/29/2023   AST 22 07/29/2023   ALKPHOS 30 (L) 07/29/2023   BILITOT 0.5 07/29/2023   Lab Results   Component Value Date   VD25OH 80.07 07/29/2023   Lab Results  Component Value Date   CHOL 192 07/29/2023   HDL 99.50 07/29/2023   LDLCALC 79 07/29/2023   TRIG 69.0 07/29/2023   CHOLHDL 2 07/29/2023   Lab Results  Component Value Date   WBC 5.3 07/29/2023   HGB 14.1 07/29/2023   HCT 40.8 07/29/2023   MCV 92.9 07/29/2023   PLT 311.0 07/29/2023   Medicare annual wellness visit, subsequent Assessment & Plan: We discussed the importance of staying active, physically and mentally, as well as the benefits of a healthy/balance diet. Low impact exercise that involve stretching and strengthing are ideal. Vaccines up to date. We discussed preventive screening for the next 5-10 years, summery of recommendations given in AVS. Fall prevention. Has mammogram and DEXA scheduled. Advance directives and end of life discussed, she has a living will and POA. Next AWV in 1 year.   Hypertension, essential, benign Assessment & Plan: BP adequately controlled. Continue losartan  50 mg daily and low-salt diet. Eye exam is current. Recommend monitoring BP at least twice per month. As far as problem is stable, we can continue annual follow-up.  Orders: -     Comprehensive metabolic panel with GFR; Future -     Losartan  Potassium; Take 1 tablet (50 mg total) by mouth daily.  Dispense: 90 tablet; Refill: 3  Vitamin D  deficiency, unspecified Assessment & Plan: Continue current dose of vitamin D  supplementation, 5000 units weekly. Further recommendation will be given according to 25 OH vitamin D  result.  Orders: -     Comprehensive metabolic panel with GFR; Future -     VITAMIN D  25 Hydroxy (Vit-D Deficiency, Fractures); Future  Hyperlipidemia, unspecified hyperlipidemia type Assessment & Plan: Continue nonpharmacologic treatment. Further recommendation will be given according to lipid panel result.  Orders: -     Comprehensive metabolic panel with GFR; Future -     Lipid panel;  Future  Tremor This is a new problem, noted at the beginning of this year. We discussed possible etiologies, it is more noticeable at rest, so Parkinson disease is a concern. Neurology referral placed. Monitor for new symptoms. Recommend decreasing alcohol  intake. Further recommendation will be given according to lab results.  -     CBC; Future -     TSH; Future -     Vitamin B12; Future -     Ambulatory referral to Neurology  Localized osteoporosis without current pathological fracture Assessment & Plan: Currently on Prolia  every 6 months. Continue adequate calcium (1000 mcg through diet) and vitamin D  intake. Continue fall prevention and regular physical activity, at weightbearing exercises 3 times per week. She has DEXA scheduled.  Return in about 1 year (around 07/28/2024) for CPE, chronic problems, Labs.  I,Emily Lagle,acting as a Neurosurgeon for Christe Tellez [place], MD.,have documented all relevant documentation on the behalf of Alpha Mysliwiec [place], MD,as directed by  Fayth Trefry [place], MD while in the presence of Nilson Tabora [place], MD.   I, Devon Kingdon [place], MD, have reviewed all documentation for this visit. The documentation on 07/29/23 for the exam, diagnosis, procedures, and orders are all accurate and complete.  Jeffery Gammell G. [place], MD  [facility].  [place] office.

## 2023-07-27 ENCOUNTER — Other Ambulatory Visit (HOSPITAL_COMMUNITY): Payer: Self-pay

## 2023-07-28 ENCOUNTER — Other Ambulatory Visit (HOSPITAL_COMMUNITY): Payer: Self-pay

## 2023-07-28 ENCOUNTER — Other Ambulatory Visit: Payer: Self-pay

## 2023-07-28 MED ORDER — VALACYCLOVIR HCL 1 G PO TABS
ORAL_TABLET | ORAL | 10 refills | Status: DC
  Filled 2023-07-28: qty 12, 3d supply, fill #0

## 2023-07-29 ENCOUNTER — Ambulatory Visit: Payer: Self-pay | Admitting: Family Medicine

## 2023-07-29 ENCOUNTER — Encounter: Payer: Self-pay | Admitting: Family Medicine

## 2023-07-29 ENCOUNTER — Ambulatory Visit: Admitting: Family Medicine

## 2023-07-29 ENCOUNTER — Other Ambulatory Visit (HOSPITAL_COMMUNITY): Payer: Self-pay

## 2023-07-29 VITALS — BP 122/80 | HR 82 | Temp 98.1°F | Resp 16 | Ht 66.0 in | Wt 167.1 lb

## 2023-07-29 DIAGNOSIS — E559 Vitamin D deficiency, unspecified: Secondary | ICD-10-CM

## 2023-07-29 DIAGNOSIS — I10 Essential (primary) hypertension: Secondary | ICD-10-CM | POA: Diagnosis not present

## 2023-07-29 DIAGNOSIS — R251 Tremor, unspecified: Secondary | ICD-10-CM

## 2023-07-29 DIAGNOSIS — M816 Localized osteoporosis [Lequesne]: Secondary | ICD-10-CM | POA: Diagnosis not present

## 2023-07-29 DIAGNOSIS — Z0000 Encounter for general adult medical examination without abnormal findings: Secondary | ICD-10-CM

## 2023-07-29 DIAGNOSIS — E785 Hyperlipidemia, unspecified: Secondary | ICD-10-CM | POA: Diagnosis not present

## 2023-07-29 LAB — COMPREHENSIVE METABOLIC PANEL WITH GFR
ALT: 17 U/L (ref 0–35)
AST: 22 U/L (ref 0–37)
Albumin: 4.2 g/dL (ref 3.5–5.2)
Alkaline Phosphatase: 30 U/L — ABNORMAL LOW (ref 39–117)
BUN: 17 mg/dL (ref 6–23)
CO2: 30 meq/L (ref 19–32)
Calcium: 9.5 mg/dL (ref 8.4–10.5)
Chloride: 98 meq/L (ref 96–112)
Creatinine, Ser: 0.79 mg/dL (ref 0.40–1.20)
GFR: 71.33 mL/min (ref >60.00)
Glucose, Bld: 96 mg/dL (ref 70–99)
Potassium: 3.9 meq/L (ref 3.5–5.1)
Sodium: 136 meq/L (ref 135–145)
Total Bilirubin: 0.5 mg/dL (ref 0.2–1.2)
Total Protein: 6.5 g/dL (ref 6.0–8.3)

## 2023-07-29 LAB — VITAMIN D 25 HYDROXY (VIT D DEFICIENCY, FRACTURES): VITD: 80.07 ng/mL (ref 30.00–100.00)

## 2023-07-29 LAB — LIPID PANEL
Cholesterol: 192 mg/dL (ref 0–200)
HDL: 99.5 mg/dL (ref >39.00)
LDL Cholesterol: 79 mg/dL (ref 0–99)
NonHDL: 92.37
Total CHOL/HDL Ratio: 2
Triglycerides: 69 mg/dL (ref 0.0–149.0)
VLDL: 13.8 mg/dL (ref 0.0–40.0)

## 2023-07-29 LAB — CBC
HCT: 40.8 % (ref 36.0–46.0)
Hemoglobin: 14.1 g/dL (ref 12.0–15.0)
MCHC: 34.5 g/dL (ref 30.0–36.0)
MCV: 92.9 fl (ref 78.0–100.0)
Platelets: 311 10*3/uL (ref 150.0–400.0)
RBC: 4.39 Mil/uL (ref 3.87–5.11)
RDW: 13.5 % (ref 11.5–15.5)
WBC: 5.3 10*3/uL (ref 4.0–10.5)

## 2023-07-29 LAB — VITAMIN B12: Vitamin B-12: 997 pg/mL — ABNORMAL HIGH (ref 211–911)

## 2023-07-29 LAB — TSH: TSH: 1.65 u[IU]/mL (ref 0.35–5.50)

## 2023-07-29 MED ORDER — LOSARTAN POTASSIUM 50 MG PO TABS
50.0000 mg | ORAL_TABLET | Freq: Every day | ORAL | 3 refills | Status: AC
  Filled 2023-07-29 – 2023-08-19 (×2): qty 90, 90d supply, fill #0
  Filled 2023-11-17: qty 90, 90d supply, fill #1
  Filled 2024-02-15: qty 90, 90d supply, fill #2

## 2023-07-29 NOTE — Assessment & Plan Note (Signed)
 Continue nonpharmacologic treatment. Further recommendation will be given according to lipid panel result.

## 2023-07-29 NOTE — Assessment & Plan Note (Signed)
 Continue current dose of vitamin D  supplementation, 5000 units weekly. Further recommendation will be given according to 25 OH vitamin D  result.

## 2023-07-29 NOTE — Assessment & Plan Note (Addendum)
 We discussed the importance of staying active, physically and mentally, as well as the benefits of a healthy/balance diet. Low impact exercise that involve stretching and strengthing are ideal. Vaccines up to date. We discussed preventive screening for the next 5-10 years, summery of recommendations given in AVS. Fall prevention. Has mammogram and DEXA scheduled. Advance directives and end of life discussed, she has a living will and POA. Next AWV in 1 year.

## 2023-07-29 NOTE — Patient Instructions (Addendum)
  Karen Mcclain , Thank you for taking time to come for your Medicare Wellness Visit. I appreciate your ongoing commitment to your health goals. Please review the following plan we discussed and let me know if I can assist you in the future.   These are the goals we discussed:  Goals       general (pt-stated)      Continue walking daily. Fall precautions. Weightbearing exercises 3 times per week.                         This is a list of the screening recommended for you and due dates:  Health Maintenance  Topic Date Due   Flu Shot  09/04/2023   Medicare Annual Wellness Visit  07/28/2024   DTaP/Tdap/Td vaccine (3 - Td or Tdap) 02/03/2029   Pneumococcal Vaccine for age over 94  Completed   DEXA scan (bone density measurement)  Completed   COVID-19 Vaccine  Completed   Hepatitis C Screening  Completed   Zoster (Shingles) Vaccine  Completed   Hepatitis B Vaccine  Aged Out   HPV Vaccine  Aged Out   Meningitis B Vaccine  Aged Out   Colon Cancer Screening  Discontinued   A few things to remember from today's visit:  Medicare annual wellness visit, subsequent  Hypertension, essential, benign - Plan: Comprehensive metabolic panel with GFR, losartan  (COZAAR ) 50 MG tablet  Vitamin D  deficiency, unspecified - Plan: Comprehensive metabolic panel with GFR, VITAMIN D  25 Hydroxy (Vit-D Deficiency, Fractures)  Hyperlipidemia, unspecified hyperlipidemia type - Plan: Comprehensive metabolic panel with GFR, Lipid panel  Tremor - Plan: CBC, TSH, Vitamin B12, Ambulatory referral to Neurology  Monitor blood pressure regularly. Decrease alcohol  intake from daily to 2-3 times per week.  If you need refills for medications you take chronically, please call your pharmacy. Do not use My Chart to request refills or for acute issues that need immediate attention. If you send a my chart message, it may take a few days to be addressed, specially if I am not in the office.  Please be sure  medication list is accurate. If a new problem present, please set up appointment sooner than planned today.

## 2023-07-29 NOTE — Assessment & Plan Note (Signed)
 Currently on Prolia  every 6 months. Continue adequate calcium (1000 mcg through diet) and vitamin D  intake. Continue fall prevention and regular physical activity, at weightbearing exercises 3 times per week. She has DEXA scheduled.

## 2023-07-29 NOTE — Assessment & Plan Note (Signed)
 BP adequately controlled. Continue losartan  50 mg daily and low-salt diet. Eye exam is current. Recommend monitoring BP at least twice per month. As far as problem is stable, we can continue annual follow-up.

## 2023-07-30 ENCOUNTER — Encounter: Payer: Self-pay | Admitting: Neurology

## 2023-08-03 NOTE — Progress Notes (Unsigned)
 Assessment/Plan:  Parkinsonism.  I suspect that this does represent idiopathic Parkinson's disease.    -We discussed the diagnosis as well as pathophysiology of the disease.  We discussed treatment options as well as prognostic indicators.  Patient education was provided.  -We decided to add carbidopa/levodopa 25/100.  1/2 tab tid x 1 wk, then 1/2 in am & noon & 1 at night for a week, then 1/2 in am &1 at noon &night for a week, then 1 po tid.  Risks, benefits, side effects and alternative therapies were discussed.  The opportunity to ask questions was given and they were answered to the best of my ability.  The patient expressed understanding and willingness to follow the outlined treatment protocols.  -I will refer the patient to the Parkinson's program at the [facility], for PT  -long discussion re: importance of safe, CV exercise.  She is not very excited about that but understands importance  -We discussed community resources in the area including patient support groups and community exercise programs for PD and pt education was provided to the patient.   2.  Alcohol  use  -discussed to decrease from 2/day to 2 days per week  3.  chronic UTI  -on suppressive keflex  nightly  -follows with [facility]  Subjective:   Karen Mcclain was seen today in the movement disorders clinic for neurologic consultation at the request of [place], Betty G, MD.  The consultation is for the evaluation of resting tremor, right greater than left for about 6 months ago.  Tremor: Yes.     How long has it been going on? 6 months  At rest or with activation?  rest  When is it noted the most?  Sitting/walking  Fam hx of tremor?  No.  Located where?  R>L but started in both at same time; she is R handed  Affected by caffeine:  No.  Affected by alcohol :  No.  Affected by stress:  unsure  Spills soup if on spoon:  No.  Spills glass of liquid if full:  No.  Affects ADL's (tying shoes,  brushing teeth, etc):  No.  Tremor inducing meds:  No.  Other Specific Symptoms:  Voice: no change Sleep:   Vivid Dreams:  No.  Acting out dreams:  No. Wet Pillows: No. Postural symptoms:  No.  Falls?  No. Bradykinesia symptoms: some trouble OOC due to degenerative arthritis; no shuffle; no slowed movements; no drooling Loss of smell:  No. Loss of taste:  No. Urinary Incontinence:  has stress incontinence and has hx of chronic UTI; sees urology Difficulty Swallowing:  rarely on spit Handwriting, micrographia: Yes.   Trouble with ADL's:  No.  Trouble buttoning clothing: No. Depression:  No. Memory changes:  No. Hallucinations:  No.  visual distortions: No. N/V:  No. Lightheaded:  No.  Syncope: No. Diplopia:  No. Dyskinesia:  No.  Neuroimaging of the brain has not previously been performed.    ALLERGIES:  No Known Allergies  CURRENT MEDICATIONS:  Current Outpatient Medications  Medication Instructions   cephALEXin  (KEFLEX ) 250 mg, Oral, Nightly   clobetasol  cream (TEMOVATE ) 0.05 % Apply topically 2 times daily for 14 days, then as needed.   COVID-19 mRNA vaccine 2024-2025 (COMIRNATY ) syringe Intramuscular   Dexilant  30 mg, Oral, Daily   estradiol  (ESTRACE  VAGINAL) 0.1 MG/GM vaginal cream Apply a fingertip length amount to the urethra 3 nights per week   losartan  (COZAAR ) 50 mg, Oral, Daily   methocarbamol  (ROBAXIN ) 500 mg,  Oral, Every 6 hours PRN   Multiple Vitamin (MULTIVITAMIN) tablet 1 tablet   prednisoLONE  acetate (PRED FORTE ) 1 % ophthalmic suspension Instill 1 drop into affected eye four times a day for 5 days then stop   predniSONE  (DELTASONE ) 10 mg, Oral, Daily with breakfast   valACYclovir  (VALTREX ) 1000 MG tablet Take 2 tablets by mouth twice a day at first sign of symptoms, repeat dose in 12 hours. For fever blisters.    Objective:   PHYSICAL EXAMINATION:    VITALS:   Vitals:   08/04/23 1311  BP: 132/88  Pulse: 89  SpO2: 97%  Weight: 165 lb 9.6 oz  (75.1 kg)  Height: 5' 6 (1.676 m)    GEN:  The patient appears stated age and is in NAD. HEENT:  Normocephalic, atraumatic.  The mucous membranes are moist. The superficial temporal arteries are without ropiness or tenderness. CV:  RRR Lungs:  CTAB Neck/HEME:  There are no carotid bruits bilaterally.  Neurological examination:  Orientation: The patient is alert and oriented x3.  Cranial nerves: There is good facial symmetry with pseudoptosis on the L.  Extraocular muscles are intact. The visual fields are full to confrontational testing. The speech is fluent and clear. Soft palate rises symmetrically and there is no tongue deviation. Hearing is intact to conversational tone. Sensation: Sensation is intact to light touch throughout (facial, trunk, extremities). Vibration is intact at the bilateral big toe. There is no extinction with double simultaneous stimulation.  Motor: Strength is 5/5 in the bilateral upper and lower extremities.   Shoulder shrug is equal and symmetric.  There is no pronator drift. Deep tendon reflexes: Deep tendon reflexes are 2-/4 at the bilateral biceps, triceps, brachioradialis, patella and achilles. Plantar responses are downgoing bilaterally.  Movement examination: Tone: There is mild to mod increased tone in the LUE.  Tone elsewhere is normal Abnormal movements: there is L>RUE rest tremor that increases with distraction Coordination:  There is mild decremation with RAM's, with any form of RAMS, including alternating supination and pronation of the forearm, hand opening and closing, finger taps, heel taps and toe taps on the L.  There is motor overflow into the jaw.   Gait and Station: The patient has no difficulty arising out of a deep-seated chair without the use of the hands. The patient's stride length is good.    I have reviewed and interpreted the following labs independently   Chemistry      Component Value Date/Time   NA 136 07/29/2023 1147   K 3.9  07/29/2023 1147   CL 98 07/29/2023 1147   CO2 30 07/29/2023 1147   BUN 17 07/29/2023 1147   CREATININE 0.79 07/29/2023 1147   CREATININE 0.78 09/15/2019 0803      Component Value Date/Time   CALCIUM 9.5 07/29/2023 1147   ALKPHOS 30 (L) 07/29/2023 1147   AST 22 07/29/2023 1147   ALT 17 07/29/2023 1147   BILITOT 0.5 07/29/2023 1147      Lab Results  Component Value Date   TSH 1.65 07/29/2023   Lab Results  Component Value Date   WBC 5.3 07/29/2023   HGB 14.1 07/29/2023   HCT 40.8 07/29/2023   MCV 92.9 07/29/2023   PLT 311.0 07/29/2023      Total time spent on today's visit was  60 minutes, including both face-to-face time and nonface-to-face time.  Time included that spent on review of records (prior notes available to me/labs/imaging if pertinent), discussing treatment and goals, answering patient's  questions and coordinating care.  Cc:  [place], Betty G, MD

## 2023-08-04 ENCOUNTER — Ambulatory Visit: Admitting: Neurology

## 2023-08-04 ENCOUNTER — Encounter: Payer: Self-pay | Admitting: Neurology

## 2023-08-04 ENCOUNTER — Other Ambulatory Visit (HOSPITAL_COMMUNITY): Payer: Self-pay

## 2023-08-04 VITALS — BP 132/88 | HR 89 | Ht 66.0 in | Wt 165.6 lb

## 2023-08-04 DIAGNOSIS — F1090 Alcohol use, unspecified, uncomplicated: Secondary | ICD-10-CM

## 2023-08-04 DIAGNOSIS — G20A1 Parkinson's disease without dyskinesia, without mention of fluctuations: Secondary | ICD-10-CM | POA: Diagnosis not present

## 2023-08-04 MED ORDER — CARBIDOPA-LEVODOPA 25-100 MG PO TABS
1.0000 | ORAL_TABLET | Freq: Three times a day (TID) | ORAL | 1 refills | Status: DC
  Filled 2023-08-04: qty 270, 90d supply, fill #0
  Filled 2023-11-01: qty 270, 90d supply, fill #1

## 2023-08-04 NOTE — Patient Instructions (Signed)
 Start Carbidopa Levodopa as follows: Take 1/2 tablet three times daily, at least 30 minutes before meals (approximately 7am/11am/4pm), for one week Then take 1/2 tablet in the morning, 1/2 tablet in the afternoon, 1 tablet in the evening, at least 30 minutes before meals, for one week Then take 1/2 tablet in the morning, 1 tablet in the afternoon, 1 tablet in the evening, at least 30 minutes before meals, for one week Then take 1 tablet three times daily at 7am/11am/4pm, at least 30 minutes before meals   As a reminder, carbidopa/levodopa can be taken at the same time as a carbohydrate, but we like to have you take your pill either 30 minutes before a protein source or 1 hour after as protein can interfere with carbidopa/levodopa absorption.   SAVE THE DATE!  We are planning a Parkinsons Disease educational symposium at [facility], [address] [place], [place], [place] [address] on September 19.  We will have a movement disorder physician expert from [employer] coming to speak and a caregiver speaker.  We will have a panel of experts that will show you who you may need on your team of people on your journey with Parkinsons.  I hope to see you there!  Use this QR code to register by scanning it with the camera app on your phone:      Need more help with registration?  Contact [email or web address]

## 2023-08-05 ENCOUNTER — Encounter: Payer: Self-pay | Admitting: Neurology

## 2023-08-05 ENCOUNTER — Ambulatory Visit: Payer: PPO | Admitting: Family Medicine

## 2023-08-05 ENCOUNTER — Telehealth: Payer: Self-pay | Admitting: Neurology

## 2023-08-05 NOTE — Telephone Encounter (Signed)
 Patient has some questions about food and other things she is  newly DX with Parkinson

## 2023-08-05 NOTE — Telephone Encounter (Signed)
 Called patient and answered questions

## 2023-08-19 ENCOUNTER — Other Ambulatory Visit (HOSPITAL_COMMUNITY): Payer: Self-pay

## 2023-08-26 ENCOUNTER — Other Ambulatory Visit: Payer: Self-pay

## 2023-08-26 ENCOUNTER — Encounter: Payer: Self-pay | Admitting: Physical Therapy

## 2023-08-26 ENCOUNTER — Ambulatory Visit: Attending: Neurology | Admitting: Physical Therapy

## 2023-08-26 DIAGNOSIS — G20A1 Parkinson's disease without dyskinesia, without mention of fluctuations: Secondary | ICD-10-CM | POA: Diagnosis not present

## 2023-08-26 DIAGNOSIS — R2689 Other abnormalities of gait and mobility: Secondary | ICD-10-CM | POA: Diagnosis not present

## 2023-08-26 DIAGNOSIS — M6281 Muscle weakness (generalized): Secondary | ICD-10-CM | POA: Diagnosis not present

## 2023-08-26 DIAGNOSIS — R293 Abnormal posture: Secondary | ICD-10-CM | POA: Diagnosis not present

## 2023-08-26 DIAGNOSIS — R2681 Unsteadiness on feet: Secondary | ICD-10-CM | POA: Diagnosis not present

## 2023-08-26 DIAGNOSIS — R29818 Other symptoms and signs involving the nervous system: Secondary | ICD-10-CM | POA: Diagnosis not present

## 2023-08-26 NOTE — Therapy (Signed)
 OUTPATIENT PHYSICAL THERAPY NEURO EVALUATION   Patient Name: Karen Mcclain MRN: [id number] DOB:01/16/1946, 78 y.o., female Today's Date: 08/26/2023   PCP: [place], Betty G, MD REFERRING PROVIDER: Evonnie Asberry RAMAN, DO   END OF SESSION:  PT End of Session - 08/26/23 1319     Visit Number 1    Number of Visits 8    Date for PT Re-Evaluation 10/16/23    Authorization Type HTA    PT Start Time 1314    PT Stop Time 1400    PT Time Calculation (min) 46 min    Activity Tolerance Patient tolerated treatment well    Behavior During Therapy [facility] for tasks assessed/performed          Past Medical History:  Diagnosis Date   Cataract 11/2018   GERD (gastroesophageal reflux disease)    Hyperlipidemia 09/07/2019   Hypertension 03/2015   Medicare annual wellness visit, subsequent 07/29/2023   Osteoporosis 09/07/2019   Past Surgical History:  Procedure Laterality Date   CATARACT EXTRACTION Left    SHOULDER SURGERY Right    TUBAL LIGATION     Patient Active Problem List   Diagnosis Date Noted   Medicare annual wellness visit, subsequent 07/29/2023   Recurrent UTI 02/04/2022   GERD (gastroesophageal reflux disease) 05/15/2020   Hot flashes, menopausal 05/15/2020   Hypertension, essential, benign 09/07/2019   Hyperlipidemia 09/07/2019   Vitamin D  deficiency, unspecified 09/07/2019   Osteoporosis 09/07/2019   Upper airway cough syndrome 09/14/2014    ONSET DATE: 08/04/2023 (MD referral)  REFERRING DIAG: G20.A1 (ICD-10-CM) - Parkinson's disease without dyskinesia or fluctuating manifestations (HCC)   THERAPY DIAG:  Unsteadiness on feet  Other symptoms and signs involving the nervous system  Other abnormalities of gait and mobility  Muscle weakness (generalized)  Abnormal posture  Rationale for Evaluation and Treatment: Rehabilitation  SUBJECTIVE:                                                                                                                                                                                              SUBJECTIVE STATEMENT: Have noticed tremors > 6 months and noted to Dr. Sw[place].  She referred me to Dr. Evonnie and she thinks I have Parkinson's disease.  I'm on the third week of taking the Carbi-dopa/Levodopa .  I'm active, with walking daily with my neighbor.    Pt accompanied by: self  PERTINENT HISTORY: resting tremor for about 6 months; dx with Parkinson's 08/04/2023 Dr. Evonnie; osteoporosis, degenerative scoliosis  PAIN:  Are you having pain? Back hurts all the time, but that is long standing  PRECAUTIONS: Fall and Other: osteoporosis  Avoid excessive bending and twisting  RED FLAGS: None   WEIGHT BEARING RESTRICTIONS: No  FALLS: Has patient fallen in last 6 months? No  LIVING ENVIRONMENT: Lives with: lives with their family Lives in: House/apartment Stairs: stairs to basement with rail Has following equipment at home: None  PLOF: Independent and Leisure: walks daily, does exercises for back from previous PT; works as bookkeeper part-time  PATIENT GOALS: To continue to live independently and as physically able and active as possible  OBJECTIVE:  Note: Objective measures were completed at Evaluation unless otherwise noted.  DIAGNOSTIC FINDINGS: NA for this episode  COGNITION: Overall cognitive status: Within functional limits for tasks assessed   SENSATION: Light touch: WFL  COORDINATION: WFL  MUSCLE TONE: WFL BLE  POSTURE: rounded shoulders, forward head, and R shoulder lower than L  LOWER EXTREMITY ROM:   A/ROM WFL  LOWER EXTREMITY MMT:    MMT Right Eval Left Eval  Hip flexion 4+ 4+  Hip extension    Hip abduction 4 4  Hip adduction 4 4  Hip internal rotation    Hip external rotation    Knee flexion 5 5  Knee extension 4+ 4+  Ankle dorsiflexion 4 4  Ankle plantarflexion    Ankle inversion    Ankle eversion    (Blank rows = not tested)  TRANSFERS: Sit to stand: Modified independence   Assistive device utilized: None     Stand to sit: Modified independence  Assistive device utilized: None      GAIT: Findings: Gait Characteristics: step through pattern, decreased trunk rotation, and narrow BOS, Distance walked: 50 ft, Assistive device utilized:None, and Level of assistance: Modified independence  FUNCTIONAL TESTS:  5 times sit to stand: 13.31 sec Timed up and go (TUG): 11.5 sec 10 meter walk test: 9.63 sec = 3.41 ft/sec  Mini-BESTest: 21/28 3 MBWT: 5.78 sec TUG cognitive: 11.34 sec with 1 error 360 turn R: 7 steps 360 turn L: 7 steps                                                                                                                               TREATMENT DATE: 08/26/2023    PATIENT EDUCATION: Education details: Eval results, POC, rationale for PD Fitness recommendations Person educated: Patient Education method: Explanation Education comprehension: verbalized understanding  HOME EXERCISE PROGRAM: Not yet initiated  GOALS: Goals reviewed with patient? Yes  SHORT TERM GOALS: Target date: 09/18/2023  Pt will be independent with HEP for improved strength, balance, gait. Baseline: Goal status: INITIAL  2.  Pt will improve 5x sit<>stand to less than or equal to 12 sec to demonstrate improved functional strength and transfer efficiency. Baseline: 13.31 sec Goal status: INITIAL   LONG TERM GOALS: Target date: 10/16/2023  Pt will be independent with HEP for improved strength, balance, gait. Baseline:  Goal status: INITIAL  2.  Pt will improve MiniBESTest score to at least 24/28 to decrease fall risk. Baseline:  21/28 Goal status: INITIAL  3.  Pt will verbalize understanding of local Parkinson's disease community resources and fitness recommendations.  Baseline:  Goal status: INITIAL  ASSESSMENT:  CLINICAL IMPRESSION: Patient is a 78 y.o. female who was seen today for physical therapy evaluation and treatment for Parkinson's disease. She  has reports of RUE>LUE tremor, which has been present > 6 months.  She has been recently diagnosed with Parkinson's disease and has been on Sinemet  for 3 weeks, working up to full dose.  She presents today with mild slowing/bradykinesia, mild decrease in functional strength, BUE tremors, decreased balance, decreased timing and coordination of gait, abnormal gait, postural instability.  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  She would benefit from skilled PT to address the above stated deficits to decrease fall risk, to improve overall functional mobility, and to establish comprehensive Parkinson's specific exercise program.  OBJECTIVE IMPAIRMENTS: Abnormal gait, decreased balance, decreased strength, and postural dysfunction.   ACTIVITY LIMITATIONS: bending, squatting, and locomotion level  PARTICIPATION LIMITATIONS: community activity and yard work  PERSONAL FACTORS: 3+ comorbidities: See above; newly dx PD are also affecting patient's functional outcome.   REHAB POTENTIAL: Good  CLINICAL DECISION MAKING: Stable/uncomplicated  EVALUATION COMPLEXITY: Low  PLAN:  PT FREQUENCY: 1x/week  PT DURATION: 8 weeks including eval week  PLANNED INTERVENTIONS: 97110-Therapeutic exercises, 97530- Therapeutic activity, 97112- Neuromuscular re-education, 97535- Self Care, [id number]- Manual therapy, [phone]- Gait training, Patient/Family education, and Balance training  PLAN FOR NEXT SESSION: Initiate HEP based on Parkinson's Foundation PD exercise recommendations-flexibility, functional strength, balance strategy work; [employer]! Moves in standing (careful with trunk rotation based on pt's osteoporosis)   Berania Peedin W., PT 08/26/2023, 2:26 PM  [facility] at [facility] [address] [place], [address] [place], [place] [address] Phone # [phone] Fax # [phone]

## 2023-09-03 ENCOUNTER — Ambulatory Visit (INDEPENDENT_AMBULATORY_CARE_PROVIDER_SITE_OTHER)

## 2023-09-03 DIAGNOSIS — M816 Localized osteoporosis [Lequesne]: Secondary | ICD-10-CM

## 2023-09-03 MED ORDER — DENOSUMAB 60 MG/ML ~~LOC~~ SOSY
60.0000 mg | PREFILLED_SYRINGE | Freq: Once | SUBCUTANEOUS | Status: AC
  Administered 2023-09-03: 60 mg via SUBCUTANEOUS

## 2023-09-03 NOTE — Progress Notes (Signed)
 After obtaining consent, and per orders of Dr. Mercer, injection of Prolia  60mg /mL given by [name]. Patient instructed to remain in clinic for 20 minutes afterwards, and to report any adverse reaction to me immediately.

## 2023-09-08 ENCOUNTER — Encounter: Admitting: Family Medicine

## 2023-09-09 ENCOUNTER — Ambulatory Visit: Attending: Neurology | Admitting: Physical Therapy

## 2023-09-09 ENCOUNTER — Encounter: Payer: Self-pay | Admitting: Physical Therapy

## 2023-09-09 DIAGNOSIS — R29818 Other symptoms and signs involving the nervous system: Secondary | ICD-10-CM | POA: Diagnosis not present

## 2023-09-09 DIAGNOSIS — R293 Abnormal posture: Secondary | ICD-10-CM | POA: Diagnosis not present

## 2023-09-09 DIAGNOSIS — R2681 Unsteadiness on feet: Secondary | ICD-10-CM | POA: Insufficient documentation

## 2023-09-09 DIAGNOSIS — M6281 Muscle weakness (generalized): Secondary | ICD-10-CM | POA: Diagnosis not present

## 2023-09-09 DIAGNOSIS — R2689 Other abnormalities of gait and mobility: Secondary | ICD-10-CM | POA: Diagnosis not present

## 2023-09-09 NOTE — Therapy (Signed)
 OUTPATIENT PHYSICAL THERAPY TREATMENT   Patient Name: Karen Mcclain MRN: [id number] DOB:[birth year], 78 y.o., female Today's Date: 09/09/2023   PCP: [place], Betty G, MD REFERRING PROVIDER: Evonnie Asberry RAMAN, DO   END OF SESSION:  PT End of Session - 09/09/23 1351     Visit Number 2    Number of Visits 8    Date for PT Re-Evaluation 10/16/23    Authorization Type HTA    PT Start Time 1355    PT Stop Time 1435    PT Time Calculation (min) 40 min    Activity Tolerance Patient tolerated treatment well    Behavior During Therapy [facility] for tasks assessed/performed           Past Medical History:  Diagnosis Date   Cataract 11/2018   GERD (gastroesophageal reflux disease)    Hyperlipidemia 09/07/2019   Hypertension 03/2015   Medicare annual wellness visit, subsequent 07/29/2023   Osteoporosis 09/07/2019   Past Surgical History:  Procedure Laterality Date   CATARACT EXTRACTION Left    SHOULDER SURGERY Right    TUBAL LIGATION     Patient Active Problem List   Diagnosis Date Noted   Medicare annual wellness visit, subsequent 07/29/2023   Recurrent UTI 02/04/2022   GERD (gastroesophageal reflux disease) 05/15/2020   Hot flashes, menopausal 05/15/2020   Hypertension, essential, benign 09/07/2019   Hyperlipidemia 09/07/2019   Vitamin D  deficiency, unspecified 09/07/2019   Osteoporosis 09/07/2019   Upper airway cough syndrome 09/14/2014    ONSET DATE: 08/04/2023 (MD referral)  REFERRING DIAG: G20.A1 (ICD-10-CM) - Parkinson's disease without dyskinesia or fluctuating manifestations (HCC)   THERAPY DIAG:  Unsteadiness on feet  Other symptoms and signs involving the nervous system  Other abnormalities of gait and mobility  Muscle weakness (generalized)  Abnormal posture  Rationale for Evaluation and Treatment: Rehabilitation  SUBJECTIVE:                                                                                                                                                                                              SUBJECTIVE STATEMENT: Pt hasn't noticed any difference with carbidopa  yet.   From eval: Have noticed tremors > 6 months and noted to Dr. [place].  She referred me to Dr. Evonnie and she thinks I have Parkinson's disease.  I'm on the third week of taking the Carbi-dopa/Levodopa .  I'm active, with walking daily with my neighbor.    Pt accompanied by: self  PERTINENT HISTORY: resting tremor for about 6 months; dx with Parkinson's 08/04/2023 Dr. Evonnie; osteoporosis, degenerative scoliosis  PAIN:  Are you having pain? Back hurts all the  time, but that is long standing  PRECAUTIONS: Fall and Other: osteoporosis  Avoid excessive bending and twisting  RED FLAGS: None   WEIGHT BEARING RESTRICTIONS: No  FALLS: Has patient fallen in last 6 months? No  LIVING ENVIRONMENT: Lives with: lives with their family Lives in: House/apartment Stairs: stairs to basement with rail Has following equipment at home: None  PLOF: Independent and Leisure: walks daily, does exercises for back from previous PT; works as bookkeeper part-time  PATIENT GOALS: To continue to live independently and as physically able and active as possible  OBJECTIVE:  Note: Objective measures were completed at Evaluation unless otherwise noted.  TREATMENT 09/09/23 Activity Comments  Scifit UEs/LEs L2 x 6 min >40 RPM continues   Standing PWR! Prepare and activate x10 each PWR!up PWR!rock PWR!twist PWR!step In // bars One minor LOB with PWR!step Able to come on to one leg with PWR! rock  Dynamic gait: Fwd amb with increased arm swing 4x50' each Bwd amb 2x30'  Side stepping 2x30' Walking slow march 2x30'   pt felt uncomfortable with this   Unsteady especially with shifting to L LE  Static balance on foam: Feet together EO x30 Feet together EO head nods and head turns x 30 each Feet apart EC 2x30     Most unsteady with this                                                                                                                                  PATIENT EDUCATION: Education details: PWR! Moves and Parkinson's exercise recommendations Person educated: Patient Education method: Explanation Education comprehension: verbalized understanding  HOME EXERCISE PROGRAM: Standing PWR! Moves (emailed pt videos)   DIAGNOSTIC FINDINGS: NA for this episode  COGNITION: Overall cognitive status: Within functional limits for tasks assessed   SENSATION: Light touch: WFL  COORDINATION: WFL  MUSCLE TONE: WFL BLE  POSTURE: rounded shoulders, forward head, and R shoulder lower than L  LOWER EXTREMITY ROM:   A/ROM WFL  LOWER EXTREMITY MMT:    MMT Right Eval Left Eval  Hip flexion 4+ 4+  Hip extension    Hip abduction 4 4  Hip adduction 4 4  Hip internal rotation    Hip external rotation    Knee flexion 5 5  Knee extension 4+ 4+  Ankle dorsiflexion 4 4  Ankle plantarflexion    Ankle inversion    Ankle eversion    (Blank rows = not tested)  TRANSFERS: Sit to stand: Modified independence  Assistive device utilized: None     Stand to sit: Modified independence  Assistive device utilized: None      GAIT: Findings: Gait Characteristics: step through pattern, decreased trunk rotation, and narrow BOS, Distance walked: 50 ft, Assistive device utilized:None, and Level of assistance: Modified independence  FUNCTIONAL TESTS:  5 times sit to stand: 13.31 sec Timed up and go (TUG): 11.5 sec 10 meter walk test: 9.63 sec = 3.41  ft/sec  Mini-BESTest: 21/28 3 MBWT: 5.78 sec TUG cognitive: 11.34 sec with 1 error 360 turn R: 7 steps 360 turn L: 7 steps   GOALS: Goals reviewed with patient? Yes  SHORT TERM GOALS: Target date: 09/18/2023  Pt will be independent with HEP for improved strength, balance, gait. Baseline: Goal status: INITIAL  2.  Pt will improve 5x sit<>stand to less than or equal to 12 sec to demonstrate improved functional  strength and transfer efficiency. Baseline: 13.31 sec Goal status: INITIAL   LONG TERM GOALS: Target date: 10/16/2023  Pt will be independent with HEP for improved strength, balance, gait. Baseline:  Goal status: INITIAL  2.  Pt will improve MiniBESTest score to at least 24/28 to decrease fall risk. Baseline: 21/28 Goal status: INITIAL  3.  Pt will verbalize understanding of local Parkinson's disease community resources and fitness recommendations.  Baseline:  Goal status: INITIAL  ASSESSMENT:  CLINICAL IMPRESSION: Treatment focused on educating pt on exercise for PD. Discussed and printed info from Parkinson's foundation for aerobics, strength, balance and stretching exercises. Went through standing PWR! Moves with good pt tolerance. Discussed initiating PRE next session for strengthening.   From eval: Patient is a 78 y.o. female who was seen today for physical therapy evaluation and treatment for Parkinson's disease. She has reports of RUE>LUE tremor, which has been present > 6 months.  She has been recently diagnosed with Parkinson's disease and has been on Sinemet  for 3 weeks, working up to full dose.  She presents today with mild slowing/bradykinesia, mild decrease in functional strength, BUE tremors, decreased balance, decreased timing and coordination of gait, abnormal gait, postural instability.  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  She would benefit from skilled PT to address the above stated deficits to decrease fall risk, to improve overall functional mobility, and to establish comprehensive Parkinson's specific exercise program.  OBJECTIVE IMPAIRMENTS: Abnormal gait, decreased balance, decreased strength, and postural dysfunction.   ACTIVITY LIMITATIONS: bending, squatting, and locomotion level  PARTICIPATION LIMITATIONS: community activity and yard work  PERSONAL FACTORS: 3+ comorbidities: See above; newly dx PD  are also affecting patient's functional outcome.   REHAB POTENTIAL: Good  CLINICAL DECISION MAKING: Stable/uncomplicated  EVALUATION COMPLEXITY: Low  PLAN:  PT FREQUENCY: 1x/week  PT DURATION: 8 weeks including eval week  PLANNED INTERVENTIONS: 97110-Therapeutic exercises, 97530- Therapeutic activity, 97112- Neuromuscular re-education, 97535- Self Care, 02859- Manual therapy, [phone]- Gait training, Patient/Family education, and Balance training  PLAN FOR NEXT SESSION: Parkinson's Foundation PD exercise recommendations-aerobics, flexibility, functional strength, balance strategy work; Review PWR! Moves in standing (careful with trunk rotation based on pt's osteoporosis)   Jordynne Mccown April Ma L Nicolina Hirt, PT, DPT 09/09/2023, 1:52 PM  [facility] at [facility] [address] [place], [address] [place], [place] [address] Phone # [phone] Fax # [phone]

## 2023-09-16 ENCOUNTER — Ambulatory Visit

## 2023-09-22 DIAGNOSIS — M810 Age-related osteoporosis without current pathological fracture: Secondary | ICD-10-CM | POA: Diagnosis not present

## 2023-09-22 DIAGNOSIS — Z1231 Encounter for screening mammogram for malignant neoplasm of breast: Secondary | ICD-10-CM | POA: Diagnosis not present

## 2023-09-22 LAB — HM MAMMOGRAPHY

## 2023-09-22 LAB — HM DEXA SCAN

## 2023-09-23 ENCOUNTER — Encounter: Payer: Self-pay | Admitting: Physical Therapy

## 2023-09-23 ENCOUNTER — Ambulatory Visit: Admitting: Physical Therapy

## 2023-09-23 DIAGNOSIS — R2681 Unsteadiness on feet: Secondary | ICD-10-CM | POA: Diagnosis not present

## 2023-09-23 DIAGNOSIS — R2689 Other abnormalities of gait and mobility: Secondary | ICD-10-CM

## 2023-09-23 NOTE — Therapy (Signed)
 OUTPATIENT PHYSICAL THERAPY TREATMENT   Patient Name: Karen Mcclain MRN: [id number] DOB:February 07, 1945, 78 y.o., female Today's Date: 09/23/2023   PCP: [place], Betty G, MD REFERRING PROVIDER: Evonnie Asberry RAMAN, DO   END OF SESSION:  PT End of Session - 09/23/23 1316     Visit Number 3    Number of Visits 8    Date for PT Re-Evaluation 10/16/23    Authorization Type HTA    PT Start Time 1316    PT Stop Time 1358    PT Time Calculation (min) 42 min    Activity Tolerance Patient tolerated treatment well    Behavior During Therapy [facility] for tasks assessed/performed            Past Medical History:  Diagnosis Date   Cataract 11/2018   GERD (gastroesophageal reflux disease)    Hyperlipidemia 09/07/2019   Hypertension 03/2015   Medicare annual wellness visit, subsequent 07/29/2023   Osteoporosis 09/07/2019   Past Surgical History:  Procedure Laterality Date   CATARACT EXTRACTION Left    SHOULDER SURGERY Right    TUBAL LIGATION     Patient Active Problem List   Diagnosis Date Noted   Medicare annual wellness visit, subsequent 07/29/2023   Recurrent UTI 02/04/2022   GERD (gastroesophageal reflux disease) 05/15/2020   Hot flashes, menopausal 05/15/2020   Hypertension, essential, benign 09/07/2019   Hyperlipidemia 09/07/2019   Vitamin D  deficiency, unspecified 09/07/2019   Osteoporosis 09/07/2019   Upper airway cough syndrome 09/14/2014    ONSET DATE: 08/04/2023 (MD referral)  REFERRING DIAG: G20.A1 (ICD-10-CM) - Parkinson's disease without dyskinesia or fluctuating manifestations (HCC)   THERAPY DIAG:  Unsteadiness on feet  Other abnormalities of gait and mobility  Rationale for Evaluation and Treatment: Rehabilitation  SUBJECTIVE:                                                                                                                                                                                             SUBJECTIVE STATEMENT: Everything is pretty much the  same, I think.  I have never, ever been coordinated.    From eval: Have noticed tremors > 6 months and noted to Dr. [place].  She referred me to Dr. Evonnie and she thinks I have Parkinson's disease.  I'm on the third week of taking the Carbi-dopa/Levodopa .  I'm active, with walking daily with my neighbor.    Pt accompanied by: self  PERTINENT HISTORY: resting tremor for about 6 months; dx with Parkinson's 08/04/2023 Dr. Evonnie; osteoporosis, degenerative scoliosis  PAIN:  Are you having pain? Back hurts all the time, but that is long standing  PRECAUTIONS: Fall and Other: osteoporosis  Avoid excessive bending and twisting  RED FLAGS: None   WEIGHT BEARING RESTRICTIONS: No  FALLS: Has patient fallen in last 6 months? No  LIVING ENVIRONMENT: Lives with: lives with their family Lives in: House/apartment Stairs: stairs to basement with rail Has following equipment at home: None  PLOF: Independent and Leisure: walks daily, does exercises for back from previous PT; works as bookkeeper part-time  PATIENT GOALS: To continue to live independently and as physically able and active as possible  OBJECTIVE:  Note: Objective measures were completed at Evaluation unless otherwise noted.  Current exercise routine:  walking daily.  Has been trying the PWR! Moves exercises.   Has [employer] membership and has been to [employer] TREATMENT: 09/23/2023 Activity Comments  Review of standing standing PWR! Moves  X 10 reps Used paper handout provided  Standing leg strengthening: March in place 2 x 10, 2# Hip abduction red band x 10, 2# x 10  Hip extension 2 x 10, 2# Sidestepping 2#, 5 reps Backwards/forward walk 2# 5 reps   Gait 50 ft x 6 reps, with cues to focus on increased step length Relaxed arm swing-discussed increased intensity, amplitude                                                                                                                                            PATIENT EDUCATION: Education details: Updates to HEP-see below-as part of comprehensive PD exercise program Person educated: Patient Education method: Explanation, Demo, Handouts Education comprehension: verbalized understanding, demo, needs additional practice  HOME EXERCISE PROGRAM: Standing PWR! Moves (emailed pt videos) Access Code: [email or web address] URL: [email or web address] Date: 09/23/2023 Prepared by: [facility] - Outpatient  Rehab - [facility]  Exercises-try with 2-3# weights - Standing Hip Abduction with Counter Support  - 1 x daily - 3 x weekly - 3 sets - 10 reps - Standing Hip Extension with Counter Support  - 1 x daily - 3 x weekly - 3 sets - 10 reps - Standing Marching  - 1 x daily - 3 x weekly - 3 sets - 10 reps - Heel Toe Raises with Counter Support  - 1 x daily - 3 x weekly - 3 sets - 10 reps - 3 sec hold - Sit to Stand  - 1 x daily - 7 x weekly - 3 sets - 5 reps ---------------------------------------------------------------------- FROM EVAL:  DIAGNOSTIC FINDINGS: NA for this episode  COGNITION: Overall cognitive status: Within functional limits for tasks assessed   SENSATION: Light touch: WFL  COORDINATION: WFL  MUSCLE TONE: WFL BLE  POSTURE: rounded shoulders, forward head, and R shoulder lower than L  LOWER EXTREMITY ROM:   A/ROM WFL  LOWER EXTREMITY MMT:    MMT Right Eval Left Eval  Hip flexion 4+ 4+  Hip extension    Hip abduction 4 4  Hip adduction 4 4  Hip internal rotation    Hip external rotation    Knee flexion 5 5  Knee extension 4+ 4+  Ankle dorsiflexion 4 4  Ankle plantarflexion    Ankle inversion    Ankle eversion    (Blank rows = not tested)  TRANSFERS: Sit to stand: Modified independence  Assistive device utilized: None     Stand to sit: Modified independence  Assistive device utilized: None      GAIT: Findings: Gait Characteristics: step through pattern, decreased trunk rotation, and narrow BOS, Distance  walked: 50 ft, Assistive device utilized:None, and Level of assistance: Modified independence  FUNCTIONAL TESTS:  5 times sit to stand: 13.31 sec Timed up and go (TUG): 11.5 sec 10 meter walk test: 9.63 sec = 3.41 ft/sec  Mini-BESTest: 21/28 3 MBWT: 5.78 sec TUG cognitive: 11.34 sec with 1 error 360 turn R: 7 steps 360 turn L: 7 steps   GOALS: Goals reviewed with patient? Yes  SHORT TERM GOALS: Target date: 09/18/2023  Pt will be independent with HEP for improved strength, balance, gait. Baseline: added to HEP 09/23/2023 Goal status: IN PROGRESS 09/23/2023  2.  Pt will improve 5x sit<>stand to less than or equal to 12 sec to demonstrate improved functional strength and transfer efficiency. Baseline: 13.31 sec Goal status: IN PROGRESS   LONG TERM GOALS: Target date: 10/16/2023  Pt will be independent with HEP for improved strength, balance, gait. Baseline:  Goal status: IN PROGRESS  2.  Pt will improve MiniBESTest score to at least 24/28 to decrease fall risk. Baseline: 21/28 Goal status: IN PROGRESS  3.  Pt will verbalize understanding of local Parkinson's disease community resources and fitness recommendations.  Baseline:  Goal status: IN PROGRESS  ASSESSMENT:  CLINICAL IMPRESSION: Pt presents today with no new complaints. Skilled PT session focused on review of PWR! Moves and initiated lower extremity strengthening exercises.  Initially tried red therapy band for lower extremity resistance, but pt compensates with increased trunk motion, so ankle weights are better for posture/positioning.  She does well with BLE strengthening and tips to increase step length for more relaxed arm swing.  She will continue to benefit from skilled PT towards goals for improved functional mobility and decreased fall risk.   From eval: Patient is a 78 y.o. female who was seen today for physical therapy evaluation and treatment for Parkinson's disease. She has reports of RUE>LUE tremor, which  has been present > 6 months.  She has been recently diagnosed with Parkinson's disease and has been on Sinemet  for 3 weeks, working up to full dose.  She presents today with mild slowing/bradykinesia, mild decrease in functional strength, BUE tremors, decreased balance, decreased timing and coordination of gait, abnormal gait, postural instability.  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  She would benefit from skilled PT to address the above stated deficits to decrease fall risk, to improve overall functional mobility, and to establish comprehensive Parkinson's specific exercise program.  OBJECTIVE IMPAIRMENTS: Abnormal gait, decreased balance, decreased strength, and postural dysfunction.   ACTIVITY LIMITATIONS: bending, squatting, and locomotion level  PARTICIPATION LIMITATIONS: community activity and yard work  PERSONAL FACTORS: 3+ comorbidities: See above; newly dx PD are also affecting patient's functional outcome.   REHAB POTENTIAL: Good  CLINICAL DECISION MAKING: Stable/uncomplicated  EVALUATION COMPLEXITY: Low  PLAN:  PT FREQUENCY: 1x/week  PT DURATION: 8 weeks including eval week  PLANNED INTERVENTIONS: 97110-Therapeutic exercises, 97530- Therapeutic  activity, [id number]- Neuromuscular re-education, [phone]- Self Care, 02859- Manual therapy, [phone]- Gait training, Patient/Family education, and Balance training  PLAN FOR NEXT SESSION: Review HEP updates and  work along with [employer] PD exercise recommendations-aerobics, flexibility, functional strength, balance strategy work; Review PWR! Moves in standing (careful with trunk rotation based on pt's osteoporosis)   Greig Anon, PT 09/23/23 2:00 PM Phone: [phone] Fax: [phone]  [facility] at [facility] [address] [place], [place] [address] Phone # [phone] Fax # [phone]

## 2023-09-24 ENCOUNTER — Encounter: Payer: Self-pay | Admitting: Family Medicine

## 2023-09-30 ENCOUNTER — Ambulatory Visit: Admitting: Physical Therapy

## 2023-09-30 ENCOUNTER — Encounter: Payer: Self-pay | Admitting: Physical Therapy

## 2023-09-30 DIAGNOSIS — R29818 Other symptoms and signs involving the nervous system: Secondary | ICD-10-CM

## 2023-09-30 DIAGNOSIS — R2681 Unsteadiness on feet: Secondary | ICD-10-CM

## 2023-09-30 DIAGNOSIS — R2689 Other abnormalities of gait and mobility: Secondary | ICD-10-CM

## 2023-09-30 NOTE — Therapy (Signed)
 OUTPATIENT PHYSICAL THERAPY TREATMENT   Patient Name: SIDONIE DEXHEIMER MRN: [id number] DOB:1945/12/05, 78 y.o., female Today's Date: 09/30/2023   PCP: [place], Betty G, MD REFERRING PROVIDER: Evonnie Asberry RAMAN, DO   END OF SESSION:  PT End of Session - 09/30/23 1314     Visit Number 4    Number of Visits 8    Date for PT Re-Evaluation 10/16/23    Authorization Type HTA    PT Start Time 1319    PT Stop Time 1358    PT Time Calculation (min) 39 min    Activity Tolerance Patient tolerated treatment well    Behavior During Therapy [facility] for tasks assessed/performed             Past Medical History:  Diagnosis Date   Cataract 11/2018   GERD (gastroesophageal reflux disease)    Hyperlipidemia 09/07/2019   Hypertension 03/2015   Medicare annual wellness visit, subsequent 07/29/2023   Osteoporosis 09/07/2019   Past Surgical History:  Procedure Laterality Date   CATARACT EXTRACTION Left    SHOULDER SURGERY Right    TUBAL LIGATION     Patient Active Problem List   Diagnosis Date Noted   Medicare annual wellness visit, subsequent 07/29/2023   Recurrent UTI 02/04/2022   GERD (gastroesophageal reflux disease) 05/15/2020   Hot flashes, menopausal 05/15/2020   Hypertension, essential, benign 09/07/2019   Hyperlipidemia 09/07/2019   Vitamin D  deficiency, unspecified 09/07/2019   Osteoporosis 09/07/2019   Upper airway cough syndrome 09/14/2014    ONSET DATE: 08/04/2023 (MD referral)  REFERRING DIAG: G20.A1 (ICD-10-CM) - Parkinson's disease without dyskinesia or fluctuating manifestations (HCC)   THERAPY DIAG:  Unsteadiness on feet  Other abnormalities of gait and mobility  Other symptoms and signs involving the nervous system  Rationale for Evaluation and Treatment: Rehabilitation  SUBJECTIVE:                                                                                                                                                                                              SUBJECTIVE STATEMENT: I was really encouraged by last visit.  I was very sore the next day.  Ordered the ankle weights (2#) and have been doing those.    From eval: Have noticed tremors > 6 months and noted to Dr. [place].  She referred me to Dr. Evonnie and she thinks I have Parkinson's disease.  I'm on the third week of taking the Carbi-dopa/Levodopa .  I'm active, with walking daily with my neighbor.    Pt accompanied by: self  PERTINENT HISTORY: resting tremor for about 6 months; dx with Parkinson's 08/04/2023 Dr. Evonnie;  osteoporosis, degenerative scoliosis  PAIN:  Are you having pain? Back hurts all the time, but that is long standing  PRECAUTIONS: Fall and Other: osteoporosis  Avoid excessive bending and twisting  RED FLAGS: None   WEIGHT BEARING RESTRICTIONS: No  FALLS: Has patient fallen in last 6 months? No  LIVING ENVIRONMENT: Lives with: lives with their family Lives in: House/apartment Stairs: stairs to basement with rail Has following equipment at home: None  PLOF: Independent and Leisure: walks daily, does exercises for back from previous PT; works as bookkeeper part-time  PATIENT GOALS: To continue to live independently and as physically able and active as possible  OBJECTIVE:  Note: Objective measures were completed at Evaluation unless otherwise noted.   TODAY'S TREATMENT: 09/30/2023 Activity Comments  PWR! Moves Standing Up Rock Twist  STep X 10 with min cues for eye boost Rates 5-6/10  PWR ! Moves FLOW in standing x 5 reps Visual and verbal cues of PT  Forward step and weigthshift x 10 Cues to add in arms for coordinated arm motion  Back step and weightshift x 10  BUE support and cues for rock backwards  Seated hamstring stretch, 3 x 15 each leg   Standing gastroc stretch, 3 x 15 Pt reports feeling uncoordinated  Forward/back step and weightshift x 10 reps each 1 UE support  Forward/back walking x 1 minute Forward gait x 2 minutes with cues for  relaxed arm swing More unsteady posterior direction  FTSTS:  12.13 sec Improved from 13.31 sec                                                                                                                                  PATIENT EDUCATION: Education details: How she can incorporate PWR! Moves FLOW into her routine; progress towards goals Person educated: Patient Education method: Explanation, Demo, Handouts Education comprehension: verbalized understanding, demo, needs additional practice  HOME EXERCISE PROGRAM: Standing PWR! Moves (emailed pt videos) Access Code: [email or web address] URL: [email or web address] Date: 09/23/2023 Prepared by: [facility] - Outpatient  Rehab - [facility]  Exercises-try with 2-3# weights - Standing Hip Abduction with Counter Support  - 1 x daily - 3 x weekly - 3 sets - 10 reps - Standing Hip Extension with Counter Support  - 1 x daily - 3 x weekly - 3 sets - 10 reps - Standing Marching  - 1 x daily - 3 x weekly - 3 sets - 10 reps - Heel Toe Raises with Counter Support  - 1 x daily - 3 x weekly - 3 sets - 10 reps - 3 sec hold - Sit to Stand  - 1 x daily - 7 x weekly - 3 sets - 5 reps ---------------------------------------------------------------------- FROM EVAL:  DIAGNOSTIC FINDINGS: NA for this episode  COGNITION: Overall cognitive status: Within functional limits for tasks assessed   SENSATION: Light touch: WFL  COORDINATION: WFL  MUSCLE TONE: WFL BLE  POSTURE: rounded  shoulders, forward head, and R shoulder lower than L  LOWER EXTREMITY ROM:   A/ROM WFL  LOWER EXTREMITY MMT:    MMT Right Eval Left Eval  Hip flexion 4+ 4+  Hip extension    Hip abduction 4 4  Hip adduction 4 4  Hip internal rotation    Hip external rotation    Knee flexion 5 5  Knee extension 4+ 4+  Ankle dorsiflexion 4 4  Ankle plantarflexion    Ankle inversion    Ankle eversion    (Blank rows = not tested)  TRANSFERS: Sit to stand: Modified  independence  Assistive device utilized: None     Stand to sit: Modified independence  Assistive device utilized: None      GAIT: Findings: Gait Characteristics: step through pattern, decreased trunk rotation, and narrow BOS, Distance walked: 50 ft, Assistive device utilized:None, and Level of assistance: Modified independence  FUNCTIONAL TESTS:  5 times sit to stand: 13.31 sec Timed up and go (TUG): 11.5 sec 10 meter walk test: 9.63 sec = 3.41 ft/sec  Mini-BESTest: 21/28 3 MBWT: 5.78 sec TUG cognitive: 11.34 sec with 1 error 360 turn R: 7 steps 360 turn L: 7 steps   GOALS: Goals reviewed with patient? Yes  SHORT TERM GOALS: Target date: 09/18/2023  Pt will be independent with HEP for improved strength, balance, gait. Baseline: added to HEP 09/23/2023 Goal status: IN PROGRESS 09/23/2023  2.  Pt will improve 5x sit<>stand to less than or equal to 12 sec to demonstrate improved functional strength and transfer efficiency. Baseline: 13.31 sec> 12.13 sec 09/30/2023 Goal status: MET 09/30/2023   LONG TERM GOALS: Target date: 10/16/2023  Pt will be independent with HEP for improved strength, balance, gait. Baseline:  Goal status: IN PROGRESS  2.  Pt will improve MiniBESTest score to at least 24/28 to decrease fall risk. Baseline: 21/28 Goal status: IN PROGRESS  3.  Pt will verbalize understanding of local Parkinson's disease community resources and fitness recommendations.  Baseline:  Goal status: IN PROGRESS  ASSESSMENT:  CLINICAL IMPRESSION: Pt presents today and reports working on alternating days with strengthening and PWR! Moves exercises.  Worked today on flexibility as well as balance recovery in ant/posterior directions, with pt reporting the stretches feel uncoordinated.  Did not add any new exercises to HEP, other than PWR! Moves FLOW.  She is incorporating recommendations to her exercise routine well.  She has met STG 2 for improved FTSTS.  Pt will continue to benefit  from skilled PT towards goals for improved functional mobility and decreased fall risk.   From eval: Patient is a 78 y.o. female who was seen today for physical therapy evaluation and treatment for Parkinson's disease. She has reports of RUE>LUE tremor, which has been present > 6 months.  She has been recently diagnosed with Parkinson's disease and has been on Sinemet  for 3 weeks, working up to full dose.  She presents today with mild slowing/bradykinesia, mild decrease in functional strength, BUE tremors, decreased balance, decreased timing and coordination of gait, abnormal gait, postural instability.  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  She would benefit from skilled PT to address the above stated deficits to decrease fall risk, to improve overall functional mobility, and to establish comprehensive Parkinson's specific exercise program.  OBJECTIVE IMPAIRMENTS: Abnormal gait, decreased balance, decreased strength, and postural dysfunction.   ACTIVITY LIMITATIONS: bending, squatting, and locomotion level  PARTICIPATION LIMITATIONS: community activity and yard  work  PERSONAL FACTORS: 3+ comorbidities: See above; newly dx PD are also affecting patient's functional outcome.   REHAB POTENTIAL: Good  CLINICAL DECISION MAKING: Stable/uncomplicated  EVALUATION COMPLEXITY: Low  PLAN:  PT FREQUENCY: 1x/week  PT DURATION: 8 weeks including eval week  PLANNED INTERVENTIONS: 97110-Therapeutic exercises, 97530- Therapeutic activity, [id number]- Neuromuscular re-education, 97535- Self Care, 02859- Manual therapy, [phone]- Gait training, Patient/Family education, and Balance training  PLAN FOR NEXT SESSION: Add any stretches in as well as forward/back stepping and/or corner balance.  Discuss POC.  Work along with [employer] PD exercise recommendations-aerobics, flexibility, functional strength, balance strategy work; Review PWR! Moves in  standing (careful with trunk rotation based on pt's osteoporosis)   Greig Anon, PT 09/30/23 1:59 PM Phone: [phone] Fax: [phone]  [facility] at [facility] [address] [place], [address] [place], [place] [address] Phone # [phone] Fax # [phone]

## 2023-10-06 ENCOUNTER — Encounter: Payer: Self-pay | Admitting: Family Medicine

## 2023-10-06 ENCOUNTER — Ambulatory Visit (INDEPENDENT_AMBULATORY_CARE_PROVIDER_SITE_OTHER): Admitting: Family Medicine

## 2023-10-06 VITALS — BP 126/80 | HR 88 | Temp 97.9°F | Resp 16 | Ht 66.0 in | Wt 165.0 lb

## 2023-10-06 DIAGNOSIS — I7090 Unspecified atherosclerosis: Secondary | ICD-10-CM

## 2023-10-06 DIAGNOSIS — G8929 Other chronic pain: Secondary | ICD-10-CM

## 2023-10-06 DIAGNOSIS — M816 Localized osteoporosis [Lequesne]: Secondary | ICD-10-CM

## 2023-10-06 DIAGNOSIS — M549 Dorsalgia, unspecified: Secondary | ICD-10-CM

## 2023-10-06 DIAGNOSIS — G20A1 Parkinson's disease without dyskinesia, without mention of fluctuations: Secondary | ICD-10-CM | POA: Diagnosis not present

## 2023-10-06 DIAGNOSIS — E785 Hyperlipidemia, unspecified: Secondary | ICD-10-CM

## 2023-10-06 NOTE — Assessment & Plan Note (Signed)
 Recently diagnosed. Currently on carbidopa -levodopa  25-100 mg 3 times daily, which she has tolerated well. Next appointment with neurologist in 11/2023.

## 2023-10-06 NOTE — Progress Notes (Signed)
 ACUTE VISIT Chief Complaint  Patient presents with   Medical Management of Chronic Issues   Discussed the use of AI scribe software for clinical note transcription with the patient, who gave verbal consent to proceed. History of Present Illness Karen Mcclain is a 78 year old female with past medical history significant for hypertension, vitamin D  deficiency, osteoporosis,GERD, and recently diagnosed with early Parkinson's disease who presents with concerns about recent test results and medication management.  -She has experienced worsening back pain over the past two years, which she believes is related to osteoporosis. Her back pain extends from the trapezium to the lower back. In April 2023, she injured her back while doing yard work, leading to a lumbar x-ray that showed significant scoliosis and arthritis.  She has been having back pain since injury and follows with orthopedist as needed.  She has been treated with prednisone  and muscle relaxers as needed. The pain is more comfortable when slouching, but she tries to maintain good posture.   Osteoporosis: She is currently on Prolia .  She has undergone bone density tests and mammograms on 09/22/2023. She is particularly concerned about the results indicating breast arterial calcification, screening test was done at [facility] when she was having her mammogram. Hyperlipidemia on nonpharmacologic treatment. Lab Results  Component Value Date   CHOL 192 07/29/2023   HDL 99.50 07/29/2023   LDLCALC 79 07/29/2023   TRIG 69.0 07/29/2023   CHOLHDL 2 07/29/2023   -She was diagnosed with Parkinson's disease on 08/04/2023, saw Dr. Evonnie.  She is currently taking carbidopa -levodopa  at 7 AM, 11 AM, and 4 PM, which she has tolerated well, so far she has not had side effects. Independent ADLs and IADLs.  -She manages hypertension with losartan  50 mg and takes cephalexin  250 mg daily for UTI prevention.   -She has been receiving COVID and flu  vaccinations regularly and is considering the new COVID vaccine.  Inquiring about current recommendations.  No chest pain, difficulty breathing, fever, chills, abnormal weight loss, abdominal pain, nausea, or changes in bowel habits.  She is actively engaged in physical therapy and walks six mornings a week with a neighbor. She uses two-pound ankle weights for exercises aimed at fall prevention. No recent falls.  Review of Systems  Constitutional:  Negative for activity change and appetite change.  HENT:  Negative for mouth sores and sore throat.   Respiratory:  Negative for cough and wheezing.   Genitourinary:  Negative for decreased urine volume, dysuria and hematuria.  Musculoskeletal:  Positive for back pain. Negative for gait problem.  Skin:  Negative for rash.  Neurological:  Negative for syncope, facial asymmetry and weakness.  Psychiatric/Behavioral:  Negative for confusion and hallucinations.   See other pertinent positives and negatives in HPI.  Current Outpatient Medications on File Prior to Visit  Medication Sig Dispense Refill   carbidopa -levodopa  (SINEMET  IR) 25-100 MG tablet Take 1 tablet by mouth 3 (three) times daily. Take at 7am, 11am, and 4pm as directed. 270 tablet 1   cephALEXin  (KEFLEX ) 250 MG capsule Take 1 capsule (250 mg total) by mouth at bedtime. 90 capsule 3   estradiol  (ESTRACE  VAGINAL) 0.1 MG/GM vaginal cream Apply a fingertip length amount to the urethra 3 nights per week 45 g 6   losartan  (COZAAR ) 50 MG tablet Take 1 tablet (50 mg total) by mouth daily. 90 tablet 3   methocarbamol  (ROBAXIN ) 500 MG tablet Take 1 tablet (500 mg total) by mouth every 6 (six) hours as  needed for muscle spasms. 30 tablet 1   Multiple Vitamin (MULTIVITAMIN) tablet Take 1 tablet by mouth.     valACYclovir  (VALTREX ) 1000 MG tablet Take 2 tablets by mouth twice a day at first sign of symptoms, repeat dose in 12 hours. For fever blisters. (Patient not taking: Reported on 10/06/2023) 4  tablet 5   No current facility-administered medications on file prior to visit.   Past Medical History:  Diagnosis Date   Cataract 11/2018   GERD (gastroesophageal reflux disease)    Hyperlipidemia 09/07/2019   Hypertension 03/2015   Medicare annual wellness visit, subsequent 07/29/2023   Osteoporosis 09/07/2019   No Known Allergies  Social History   Socioeconomic History   Marital status: Divorced    Spouse name: Not on file   Number of children: Not on file   Years of education: Not on file   Highest education level: Some college, no degree  Occupational History   Occupation: bookkeeper  Tobacco Use   Smoking status: Never   Smokeless tobacco: Never  Substance and Sexual Activity   Alcohol  use: Yes    Comment: 2 glasses wine/daily   Drug use: No   Sexual activity: Not on file  Other Topics Concern   Not on file  Social History Narrative   Right handed    Social Drivers of Health   Financial Resource Strain: Low Risk  (07/25/2023)   Overall Financial Resource Strain (CARDIA)    Difficulty of Paying Living Expenses: Not hard at all  Food Insecurity: No Food Insecurity (07/25/2023)   Hunger Vital Sign    Worried About Running Out of Food in the Last Year: Never true    Ran Out of Food in the Last Year: Never true  Transportation Needs: No Transportation Needs (07/25/2023)   PRAPARE - Administrator, Civil Service (Medical): No    Lack of Transportation (Non-Medical): No  Physical Activity: Insufficiently Active (07/25/2023)   Exercise Vital Sign    Days of Exercise per Week: 6 days    Minutes of Exercise per Session: 20 min  Stress: No Stress Concern Present (07/25/2023)   [employer] of Occupational Health - Occupational Stress Questionnaire    Feeling of Stress: Only a little  Social Connections: Unknown (07/25/2023)   Social Connection and Isolation Panel    Frequency of Communication with Friends and Family: More than three times a week     Frequency of Social Gatherings with Friends and Family: More than three times a week    Attends Religious Services: Never    Database administrator or Organizations: Patient declined    Attends Banker Meetings: Not on file    Marital Status: Divorced    Vitals:   10/06/23 0912  BP: 126/80  Pulse: 88  Resp: 16  Temp: 97.9 F (36.6 C)  SpO2: 99%   Body mass index is 26.63 kg/m.  Physical Exam Vitals and nursing note reviewed.  Constitutional:      General: She is not in acute distress.    Appearance: She is well-developed.  HENT:     Head: Normocephalic and atraumatic.  Eyes:     Conjunctiva/sclera: Conjunctivae normal.  Cardiovascular:     Rate and Rhythm: Normal rate and regular rhythm.     Heart sounds: No murmur heard.    Comments: DP pulses palpable. Pulmonary:     Effort: Pulmonary effort is normal. No respiratory distress.     Breath sounds: Normal breath sounds.  Abdominal:     Palpations: Abdomen is soft. There is no mass.     Tenderness: There is no abdominal tenderness.  Musculoskeletal:     Cervical back: No tenderness.     Thoracic back: No tenderness.     Lumbar back: No tenderness.     Right lower leg: No edema.     Left lower leg: No edema.  Skin:    General: Skin is warm.     Findings: No erythema or rash.  Neurological:     General: No focal deficit present.     Mental Status: She is alert and oriented to person, place, and time.     Cranial Nerves: No cranial nerve deficit.     Gait: Gait normal.  Psychiatric:        Mood and Affect: Affect is labile (when discussing nex Dx).   ASSESSMENT AND PLAN:  Karen Mcclain was seen today for follow up.  Arterial calcification Bilateral breast arteries calcifications. Aortic atherosclerosis has not been reported in prior images. We discussed options at this time, she agrees with holding on statin medication for now.  Chronic bilateral back pain, unspecified back location Assessment  & Plan: From upper back (trapezium) to lumbar paraspinal muscles. Lumbar x-ray done in 05/2021 shows significant left-sided scoliosis with advanced degenerative changes. No new associated symptoms. Follows with orthopedics regularly.  Localized osteoporosis without current pathological fracture Assessment & Plan: Last DEXA 09/2023. Currently on Prolia  every 6 months. Continue adequate calcium (1000 mcg through diet) and vitamin D  intake. Continue fall prevention and regular physical activity, at weightbearing exercises 3 times per week. Follows with ortho.  Hyperlipidemia, unspecified hyperlipidemia type Assessment & Plan: Last LDL 79 in 07/2023. Recently diagnosed with breast artery calcifications (09/2023). We discussed current recommendations in regard to LDL goals. We decided to follow on pharmacologic treatment for now.  Parkinson's disease without dyskinesia or fluctuating manifestations (HCC) Assessment & Plan: Recently diagnosed. Currently on carbidopa -levodopa  25-100 mg 3 times daily, which she has tolerated well. Next appointment with neurologist in 11/2023.  I personally spent a total of 34 minutes in the care of the patient today including preparing to see the patient, getting/reviewing separately obtained history, performing a medically appropriate exam/evaluation, counseling and educating, documenting clinical information in the EHR, and communicating results. We discussed current COVID-19 vaccination recommendations.  Return if symptoms worsen or fail to improve, for keep next appointment.  Elizebath Wever G. [place], MD  [facility].

## 2023-10-06 NOTE — Assessment & Plan Note (Signed)
 Currently on Prolia  every 6 months. Continue adequate calcium (1000 mcg through diet) and vitamin D  intake. Continue fall prevention and regular physical activity, at weightbearing exercises 3 times per week. Follows with ortho.

## 2023-10-06 NOTE — Assessment & Plan Note (Signed)
 Last LDL 79 in 07/2023. Recently diagnosed with breast artery calcifications (09/2023). We discussed current recommendations in regard to LDL goals. We decided to follow on pharmacologic treatment for now.

## 2023-10-06 NOTE — Assessment & Plan Note (Signed)
 From upper back (trapezium) to lumbar paraspinal muscles. No new associated symptoms. Follows with orthopedics regularly.

## 2023-10-06 NOTE — Patient Instructions (Signed)
 A few things to remember from today's visit:  Localized osteoporosis without current pathological fracture  Hyperlipidemia, unspecified hyperlipidemia type  Arterial calcification  Continue prolia . Fall precaution. We can hold on adding cholesterol med for now. Continue low fat diet. We can repeat labs in 07/2024.  If you need refills for medications you take chronically, please call your pharmacy. Do not use My Chart to request refills or for acute issues that need immediate attention. If you send a my chart message, it may take a few days to be addressed, specially if I am not in the office.  Please be sure medication list is accurate. If a new problem present, please set up appointment sooner than planned today.

## 2023-10-07 ENCOUNTER — Ambulatory Visit: Attending: Neurology | Admitting: Physical Therapy

## 2023-10-07 ENCOUNTER — Encounter: Payer: Self-pay | Admitting: Physical Therapy

## 2023-10-07 DIAGNOSIS — R2681 Unsteadiness on feet: Secondary | ICD-10-CM | POA: Insufficient documentation

## 2023-10-07 DIAGNOSIS — R29818 Other symptoms and signs involving the nervous system: Secondary | ICD-10-CM | POA: Insufficient documentation

## 2023-10-07 DIAGNOSIS — R2689 Other abnormalities of gait and mobility: Secondary | ICD-10-CM | POA: Insufficient documentation

## 2023-10-07 NOTE — Therapy (Signed)
 OUTPATIENT PHYSICAL THERAPY TREATMENT   Patient Name: Karen Mcclain MRN: [id number] DOB:[birth year], 78 y.o., female Today's Date: 10/07/2023   PCP: [place], Betty G, MD REFERRING PROVIDER: Evonnie Asberry RAMAN, DO   END OF SESSION:  PT End of Session - 10/07/23 1223     Visit Number 5    Number of Visits 8    Date for PT Re-Evaluation 10/16/23    Authorization Type HTA    PT Start Time 1230    PT Stop Time 1315    PT Time Calculation (min) 45 min    Activity Tolerance Patient tolerated treatment well    Behavior During Therapy [facility] for tasks assessed/performed              Past Medical History:  Diagnosis Date   Cataract 11/2018   GERD (gastroesophageal reflux disease)    Hyperlipidemia 09/07/2019   Hypertension 03/2015   Medicare annual wellness visit, subsequent 07/29/2023   Osteoporosis 09/07/2019   Past Surgical History:  Procedure Laterality Date   CATARACT EXTRACTION Left    SHOULDER SURGERY Right    TUBAL LIGATION     Patient Active Problem List   Diagnosis Date Noted   Parkinson's disease without dyskinesia or fluctuating manifestations (HCC) 10/06/2023   Back pain 10/06/2023   Medicare annual wellness visit, subsequent 07/29/2023   Recurrent UTI 02/04/2022   GERD (gastroesophageal reflux disease) 05/15/2020   Hot flashes, menopausal 05/15/2020   Hypertension, essential, benign 09/07/2019   Hyperlipidemia 09/07/2019   Vitamin D  deficiency, unspecified 09/07/2019   Osteoporosis 09/07/2019   Upper airway cough syndrome 09/14/2014    ONSET DATE: 08/04/2023 (MD referral)  REFERRING DIAG: G20.A1 (ICD-10-CM) - Parkinson's disease without dyskinesia or fluctuating manifestations (HCC)   THERAPY DIAG:  Unsteadiness on feet  Other symptoms and signs involving the nervous system  Other abnormalities of gait and mobility  Rationale for Evaluation and Treatment: Rehabilitation  SUBJECTIVE:                                                                                                                                                                                              SUBJECTIVE STATEMENT: I'm a little discouraged.  I feel like I'm not doing a good job of doing enough and often enough of the exercises.  What about the classes?  Having some soreness in the L buttock.    From eval: Have noticed tremors > 6 months and noted to Dr. [place].  She referred me to Dr. Evonnie and she thinks I have Parkinson's disease.  I'm on the third week of taking the Carbi-dopa/Levodopa .  I'm active, with  walking daily with my neighbor.    Pt accompanied by: self  PERTINENT HISTORY: resting tremor for about 6 months; dx with Parkinson's 08/04/2023 Dr. Evonnie; osteoporosis, degenerative scoliosis  PAIN:  Are you having pain? Yes: NPRS scale: 4/10 Pain location: L hip/buttocks Pain description: knot Aggravating factors: unsure Relieving factors: unsure, just started today  PRECAUTIONS: Fall and Other: osteoporosis  Avoid excessive bending and twisting  RED FLAGS: None   WEIGHT BEARING RESTRICTIONS: No  FALLS: Has patient fallen in last 6 months? No  LIVING ENVIRONMENT: Lives with: lives with their family Lives in: House/apartment Stairs: stairs to basement with rail Has following equipment at home: None  PLOF: Independent and Leisure: walks daily, does exercises for back from previous PT; works as bookkeeper part-time  PATIENT GOALS: To continue to live independently and as physically able and active as possible  OBJECTIVE:  Note: Objective measures were completed at Evaluation unless otherwise noted.   TODAY'S TREATMENT: 10/07/2023 Activity Comments  Seated piriformis stretch, 30 sec Cues for form, technique  Upper body weightbearing ex: -Counter push-ups x 5 -wall push ups x 5 -chair push ups x 5 Cues for technique, rationale  Lower body weightbearing ex: -Forward step ups x 10, BLE -Side step ups x 10, BLE Cues for technique, rationale, at 4  step                                                                                                                                         PATIENT EDUCATION: Education details: Discussed different methods of exercise-including weightbearing exercise, in the setting of osteoporosis; answered pt's questions about community classes and provided information on [employer] Moves [employer] class Person educated: Patient Education method: Explanation, Demo, Handouts Education comprehension: verbalized understanding, demo, needs additional practice  HOME EXERCISE PROGRAM: Standing [employer] (emailed pt videos) Access Code: [email or web address] URL: [email or web address] Date: 10/07/2023 Prepared by: [facility] - Outpatient  Rehab - [facility]  Exercises - Standing Hip Abduction with Counter Support  - 1 x daily - 3 x weekly - 3 sets - 10 reps - Standing Hip Extension with Counter Support  - 1 x daily - 3 x weekly - 3 sets - 10 reps - Standing Marching  - 1 x daily - 3 x weekly - 3 sets - 10 reps - Heel Toe Raises with Counter Support  - 1 x daily - 3 x weekly - 3 sets - 10 reps - 3 sec hold - Sit to Stand  - 1 x daily - 7 x weekly - 3 sets - 5 reps - Seated Figure 4 Piriformis Stretch  - 1 x daily - 7 x weekly - 1 sets - 3 reps - 30 sec hold - Wall Push Up  - 1 x daily - 7 x weekly - 2 sets - 5-10 reps - Seated Chair Push Ups  - 1 x daily -  3 x weekly - 2 sets - 5-10 reps - Forward Step Up  - 1 x daily - 3 x weekly - 1-2 sets - 10 reps - Lateral Step Up with Counter Support  - 1 x daily - 3 x weekly - 1-2 sets - 10 reps  ---------------------------------------------------------------------- FROM EVAL:  DIAGNOSTIC FINDINGS: NA for this episode  COGNITION: Overall cognitive status: Within functional limits for tasks assessed   SENSATION: Light touch: WFL  COORDINATION: WFL  MUSCLE TONE: WFL BLE  POSTURE: rounded shoulders, forward head, and R shoulder lower than L  LOWER  EXTREMITY ROM:   A/ROM WFL  LOWER EXTREMITY MMT:    MMT Right Eval Left Eval  Hip flexion 4+ 4+  Hip extension    Hip abduction 4 4  Hip adduction 4 4  Hip internal rotation    Hip external rotation    Knee flexion 5 5  Knee extension 4+ 4+  Ankle dorsiflexion 4 4  Ankle plantarflexion    Ankle inversion    Ankle eversion    (Blank rows = not tested)  TRANSFERS: Sit to stand: Modified independence  Assistive device utilized: None     Stand to sit: Modified independence  Assistive device utilized: None      GAIT: Findings: Gait Characteristics: step through pattern, decreased trunk rotation, and narrow BOS, Distance walked: 50 ft, Assistive device utilized:None, and Level of assistance: Modified independence  FUNCTIONAL TESTS:  5 times sit to stand: 13.31 sec Timed up and go (TUG): 11.5 sec 10 meter walk test: 9.63 sec = 3.41 ft/sec  Mini-BESTest: 21/28 3 MBWT: 5.78 sec TUG cognitive: 11.34 sec with 1 error 360 turn R: 7 steps 360 turn L: 7 steps   GOALS: Goals reviewed with patient? Yes  SHORT TERM GOALS: Target date: 09/18/2023  Pt will be independent with HEP for improved strength, balance, gait. Baseline: added to HEP 09/23/2023 Goal status: IN PROGRESS 09/23/2023  2.  Pt will improve 5x sit<>stand to less than or equal to 12 sec to demonstrate improved functional strength and transfer efficiency. Baseline: 13.31 sec> 12.13 sec 09/30/2023 Goal status: MET 09/30/2023   LONG TERM GOALS: Target date: 10/16/2023  Pt will be independent with HEP for improved strength, balance, gait. Baseline:  Goal status: IN PROGRESS  2.  Pt will improve MiniBESTest score to at least 24/28 to decrease fall risk. Baseline: 21/28 Goal status: IN PROGRESS  3.  Pt will verbalize understanding of local Parkinson's disease community resources and fitness recommendations.  Baseline:  Goal status: IN PROGRESS  ASSESSMENT:  CLINICAL IMPRESSION: Pt presents today with reports that  she is a bit frustrated about being consistent with her exercises.  Discussed barriers/motivating factors to exercise and worked on some upper and lower body weightbearing exercise today, in the setting of her reported osteoporosis.  Updated HEP to reflect these exercises and worked to [employer] how to be compliant with HEP consistently.  She seems pleased with these exercises as well as the options for [employer] class.  She will continue to benefit from skilled PT towards goals for improved functional mobility and decreased fall risk.   From eval: Patient is a 78 y.o. female who was seen today for physical therapy evaluation and treatment for Parkinson's disease. She has reports of RUE>LUE tremor, which has been present > 6 months.  She has been recently diagnosed with Parkinson's disease and has been on Sinemet  for 3 weeks, working up to full dose.  She presents  today with mild slowing/bradykinesia, mild decrease in functional strength, BUE tremors, decreased balance, decreased timing and coordination of gait, abnormal gait, postural instability.  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  She would benefit from skilled PT to address the above stated deficits to decrease fall risk, to improve overall functional mobility, and to establish comprehensive Parkinson's specific exercise program.  OBJECTIVE IMPAIRMENTS: Abnormal gait, decreased balance, decreased strength, and postural dysfunction.   ACTIVITY LIMITATIONS: bending, squatting, and locomotion level  PARTICIPATION LIMITATIONS: community activity and yard work  PERSONAL FACTORS: 3+ comorbidities: See above; newly dx PD are also affecting patient's functional outcome.   REHAB POTENTIAL: Good  CLINICAL DECISION MAKING: Stable/uncomplicated  EVALUATION COMPLEXITY: Low  PLAN:  PT FREQUENCY: 1x/week  PT DURATION: 8 weeks including eval week  PLANNED INTERVENTIONS:  97110-Therapeutic exercises, 97530- Therapeutic activity, [id number]- Neuromuscular re-education, 97535- Self Care, [id number]- Manual therapy, [phone]- Gait training, Patient/Family education, and Balance training  PLAN FOR NEXT SESSION: Check LTGs; discuss POC and review updates to HEP.  Will need recert next visit (likely out of date due to schedule conflicts)  Greig Anon, PT 10/07/23 4:00 PM Phone: [phone] Fax: [phone]  [facility] at [facility] [address] [place], [address] [place], [place] [address] Phone # [phone] Fax # [phone]

## 2023-10-21 ENCOUNTER — Other Ambulatory Visit: Payer: Self-pay

## 2023-10-21 DIAGNOSIS — M816 Localized osteoporosis [Lequesne]: Secondary | ICD-10-CM

## 2023-10-21 MED ORDER — DENOSUMAB 60 MG/ML ~~LOC~~ SOSY
60.0000 mg | PREFILLED_SYRINGE | SUBCUTANEOUS | Status: AC
  Administered 2024-03-08: 60 mg via SUBCUTANEOUS

## 2023-10-21 NOTE — Progress Notes (Signed)
 Pt on bone density report. Order placed for PA.

## 2023-10-26 ENCOUNTER — Other Ambulatory Visit: Payer: Self-pay | Admitting: Family

## 2023-10-26 ENCOUNTER — Ambulatory Visit: Payer: Self-pay | Admitting: Physical Therapy

## 2023-10-26 ENCOUNTER — Other Ambulatory Visit (HOSPITAL_COMMUNITY): Payer: Self-pay

## 2023-10-26 ENCOUNTER — Other Ambulatory Visit: Payer: Self-pay

## 2023-10-26 MED ORDER — METHOCARBAMOL 500 MG PO TABS
500.0000 mg | ORAL_TABLET | Freq: Four times a day (QID) | ORAL | 1 refills | Status: DC | PRN
  Filled 2023-10-26: qty 30, 8d supply, fill #0

## 2023-10-27 ENCOUNTER — Other Ambulatory Visit (HOSPITAL_COMMUNITY): Payer: Self-pay

## 2023-10-27 MED ORDER — PREDNISONE 50 MG PO TABS
ORAL_TABLET | ORAL | 0 refills | Status: AC
  Filled 2023-10-27: qty 5, 5d supply, fill #0

## 2023-10-27 MED ORDER — METHOCARBAMOL 500 MG PO TABS
500.0000 mg | ORAL_TABLET | Freq: Four times a day (QID) | ORAL | 1 refills | Status: AC | PRN
  Filled 2024-01-25: qty 30, 8d supply, fill #0

## 2023-10-28 ENCOUNTER — Other Ambulatory Visit (HOSPITAL_COMMUNITY): Payer: Self-pay

## 2023-10-28 DIAGNOSIS — N3946 Mixed incontinence: Secondary | ICD-10-CM | POA: Diagnosis not present

## 2023-10-29 ENCOUNTER — Other Ambulatory Visit (HOSPITAL_COMMUNITY): Payer: Self-pay

## 2023-10-30 ENCOUNTER — Ambulatory Visit (INDEPENDENT_AMBULATORY_CARE_PROVIDER_SITE_OTHER)

## 2023-10-30 ENCOUNTER — Ambulatory Visit: Admitting: Family Medicine

## 2023-10-30 DIAGNOSIS — Z23 Encounter for immunization: Secondary | ICD-10-CM | POA: Diagnosis not present

## 2023-10-30 NOTE — Progress Notes (Signed)
 Patient is in office today for a nurse visit for Influenza Immunization. Patient Injection was given in the  Left deltoid. Patient tolerated injection well.

## 2023-11-13 ENCOUNTER — Other Ambulatory Visit (HOSPITAL_BASED_OUTPATIENT_CLINIC_OR_DEPARTMENT_OTHER): Payer: Self-pay

## 2023-11-13 MED ORDER — COMIRNATY 30 MCG/0.3ML IM SUSY
0.3000 mL | PREFILLED_SYRINGE | Freq: Once | INTRAMUSCULAR | 0 refills | Status: AC
  Filled 2023-11-13: qty 0.3, 1d supply, fill #0

## 2023-11-17 ENCOUNTER — Other Ambulatory Visit: Payer: Self-pay

## 2023-11-20 NOTE — Progress Notes (Unsigned)
 Assessment/Plan:   1.  Parkinsons Disease  -Continue carbidopa /levodopa  25/100, 1 tablet 3 times per day.  Her exam is markedly improved on carbidopa /levodopa  even though she didn't notice it.    -discussed finding time for exercise 2.  Alcohol  use             -she has been working on decreasing it and is down to 2-3 days per week and I congratulated her on that   3.  chronic UTI             -on suppressive keflex  nightly             -follows with [facility]   Subjective:   Karen Mcclain was seen today in follow up for Parkinsons disease.  My previous records were reviewed prior to todays visit as well as outside records available to me.  Patient was diagnosed last visit and started on levodopa .  Patient tolerating the medication well, without side effects.  She has had a little less tremor but otherwise thinks she is about the same.  Patient has been to physical therapy since last visit.  She has not been to exercise.  Pt denies falls.  Pt denies lightheadedness, near syncope.  No hallucinations.  Mood has been good.  She drinks about 2-3 drinks/week (from 2 per day).  Current prescribed movement disorder medications: Carbidopa /levodopa  25/100, 1 tablet 3 times per day.  ALLERGIES:  No Known Allergies  CURRENT MEDICATIONS:  Current Meds  Medication Sig   carbidopa -levodopa  (SINEMET  IR) 25-100 MG tablet Take 1 tablet by mouth 3 (three) times daily. Take at 7am, 11am, and 4pm as directed.   cephALEXin  (KEFLEX ) 250 MG capsule Take 1 capsule (250 mg total) by mouth at bedtime.   estradiol  (ESTRACE  VAGINAL) 0.1 MG/GM vaginal cream Apply a fingertip length amount to the urethra 3 nights per week   losartan  (COZAAR ) 50 MG tablet Take 1 tablet (50 mg total) by mouth daily.   methocarbamol  (ROBAXIN ) 500 MG tablet Take 1 tablet (500 mg total) by mouth every 6 (six) hours as needed for muscle spasms.   Multiple Vitamin (MULTIVITAMIN) tablet Take 1 tablet by mouth.   predniSONE   (DELTASONE ) 50 MG tablet Take one tablet by mouth once daily for 5 days.   Current Facility-Administered Medications for the 11/24/23 encounter (Office Visit) with Draydon Clairmont, Asberry RAMAN, DO  Medication   [START ON 03/07/2024] denosumab  (PROLIA ) injection 60 mg     Objective:   PHYSICAL EXAMINATION:    VITALS:   Vitals:   11/24/23 1319  BP: (!) 130/90  SpO2: 100%  Weight: 161 lb (73 kg)  Height: 5' 6 (1.676 m)    GEN:  The patient appears stated age and is in NAD. HEENT:  Normocephalic, atraumatic.  The mucous membranes are moist. The superficial temporal arteries are without ropiness or tenderness. CV:  RRR Lungs:  CTAB Neck/HEME:  There are no carotid bruits bilaterally.  Neurological examination:  Orientation: The patient is alert and oriented x3. Cranial nerves: There is good facial symmetry with facial hypomimia. The speech is fluent and clear. Soft palate rises symmetrically and there is no tongue deviation. Hearing is intact to conversational tone. Sensation: Sensation is intact to light touch throughout Motor: Strength is at least antigravity x4.  Movement examination: Tone: There is normal tone in the UE/LE.   Abnormal movements: none (improved) Coordination:  There is no decremation with RAM's, with any form of RAMS, including alternating supination and pronation  of the forearm, hand opening and closing, finger taps, heel taps and toe taps bilateral (marked improvement)  Gait and Station: The patient has no difficulty arising out of a deep-seated chair without the use of the hands. The patient's stride length is good.    I have reviewed and interpreted the following labs independently    Chemistry      Component Value Date/Time   NA 136 07/29/2023 1147   K 3.9 07/29/2023 1147   CL 98 07/29/2023 1147   CO2 30 07/29/2023 1147   BUN 17 07/29/2023 1147   CREATININE 0.79 07/29/2023 1147   CREATININE 0.78 09/15/2019 0803      Component Value Date/Time   CALCIUM 9.5  07/29/2023 1147   ALKPHOS 30 (L) 07/29/2023 1147   AST 22 07/29/2023 1147   ALT 17 07/29/2023 1147   BILITOT 0.5 07/29/2023 1147       Lab Results  Component Value Date   WBC 5.3 07/29/2023   HGB 14.1 07/29/2023   HCT 40.8 07/29/2023   MCV 92.9 07/29/2023   PLT 311.0 07/29/2023    Lab Results  Component Value Date   TSH 1.65 07/29/2023     Total time spent on today's visit was 30 minutes, including both face-to-face time and nonface-to-face time.  Time included that spent on review of records (prior notes available to me/labs/imaging if pertinent), discussing treatment and goals, answering patient's questions and coordinating care.  Cc:  [place], Betty G, MD

## 2023-11-23 ENCOUNTER — Ambulatory Visit: Admitting: Neurology

## 2023-11-24 ENCOUNTER — Ambulatory Visit: Admitting: Neurology

## 2023-11-24 VITALS — BP 130/90 | HR 82 | Ht 66.0 in | Wt 161.0 lb

## 2023-11-24 DIAGNOSIS — G20A1 Parkinson's disease without dyskinesia, without mention of fluctuations: Secondary | ICD-10-CM | POA: Diagnosis not present

## 2023-11-24 NOTE — Patient Instructions (Signed)
 Here are some resources/books that you may find helpful as you navigate the challenges of Parkinson's Disease  1.  Parkinson's treatement: 10 secrets to a happier life by Jefferey Pica, MD 2.  Navigating Life with Parkinsons disease by Sotirios Parashos 3.  My degeneration: A journey through Parkinsons Ledora Bottcher - Shohl) 4.  Every Victory counts (I believe this one if free through [employer]) 5.  Lucky Man by Gardner Candle 6.  101 Questions & Answers about Parkinson's by Caprice Renshaw 7.  Parkinsons Disease Treatment Book by JE Ahiskog  The physicians and staff at [facility] are committed to providing excellent care. You may receive a survey requesting feedback about your experience at our office. We strive to receive "very good" responses to the survey questions. If you feel that your experience would prevent you from giving the office a "very good " response, please contact our office to try to remedy the situation. We may be reached at [phone]. Thank you for taking the time out of your busy day to complete the survey.

## 2023-12-07 ENCOUNTER — Encounter: Payer: Self-pay | Admitting: Radiology

## 2024-01-25 ENCOUNTER — Other Ambulatory Visit: Payer: Self-pay

## 2024-01-25 ENCOUNTER — Other Ambulatory Visit: Payer: Self-pay | Admitting: Neurology

## 2024-01-25 ENCOUNTER — Other Ambulatory Visit (HOSPITAL_COMMUNITY): Payer: Self-pay

## 2024-01-25 DIAGNOSIS — G20A1 Parkinson's disease without dyskinesia, without mention of fluctuations: Secondary | ICD-10-CM

## 2024-01-27 ENCOUNTER — Other Ambulatory Visit (HOSPITAL_COMMUNITY): Payer: Self-pay

## 2024-01-27 ENCOUNTER — Other Ambulatory Visit: Payer: Self-pay

## 2024-01-27 MED ORDER — CARBIDOPA-LEVODOPA 25-100 MG PO TABS
1.0000 | ORAL_TABLET | Freq: Three times a day (TID) | ORAL | 0 refills | Status: AC
  Filled 2024-01-27: qty 270, 90d supply, fill #0

## 2024-01-29 ENCOUNTER — Other Ambulatory Visit (HOSPITAL_COMMUNITY): Payer: Self-pay

## 2024-02-01 ENCOUNTER — Other Ambulatory Visit (HOSPITAL_COMMUNITY): Payer: Self-pay

## 2024-02-01 ENCOUNTER — Encounter (HOSPITAL_COMMUNITY): Payer: Self-pay

## 2024-02-01 ENCOUNTER — Other Ambulatory Visit: Payer: Self-pay | Admitting: Urology

## 2024-02-02 ENCOUNTER — Other Ambulatory Visit (HOSPITAL_COMMUNITY): Payer: Self-pay

## 2024-02-02 MED ORDER — CEPHALEXIN 250 MG PO CAPS
250.0000 mg | ORAL_CAPSULE | Freq: Every evening | ORAL | 3 refills | Status: AC
  Filled 2024-02-02: qty 90, 90d supply, fill #0

## 2024-02-03 ENCOUNTER — Other Ambulatory Visit (HOSPITAL_COMMUNITY): Payer: Self-pay

## 2024-02-10 ENCOUNTER — Telehealth: Payer: Self-pay | Admitting: *Deleted

## 2024-02-10 NOTE — Telephone Encounter (Signed)
 Copied from CRM [id number]. Topic: Clinical - Request for Lab/Test Order >> Feb 10, 2024 10:00 AM Karen Mcclain wrote: Reason for CRM: Patient called in to get scheduled for prolia  shot. Would like either 1/30 or 2/2

## 2024-02-10 NOTE — Telephone Encounter (Signed)
 Authorization has not been completed - appointment was canceled till further notice patient has been made aware. Collie is working on the process to receive the medication prolia .

## 2024-02-10 NOTE — Telephone Encounter (Signed)
 Prolia  appointment sch for 03/04/24 for Prolia  injection. Crissy informed to order medication.

## 2024-02-11 ENCOUNTER — Telehealth: Payer: Self-pay

## 2024-02-11 ENCOUNTER — Other Ambulatory Visit (HOSPITAL_COMMUNITY): Payer: Self-pay

## 2024-02-11 NOTE — Telephone Encounter (Signed)
 Pt ready for scheduling for PROLIA  on or after : 03/05/24  Option# 1: Buy/Bill (Office supplied medication)  Out-of-pocket cost due at time of clinic visit: $352  Number of injection/visits approved: ---  Primary: [employer] Prolia  co-insurance: 20% Admin fee co-insurance: 0%  Secondary: --- Prolia  co-insurance:  Admin fee co-insurance:   Medical Benefit Details: Date Benefits were checked: 02/11/24 Deductible: NO/ Coinsurance: 20%/ Admin Fee: 0%  Prior Auth: N/A PA# Expiration Date:   # of doses approved: ----------------------------------------------------------------------- Option# 2- Med Obtained from pharmacy:[facility]REF[facility] FOR PHARMACY BENEFIT  Pharmacy benefit: Copay $682.29 (Paid to pharmacy) Admin Fee: 0% (Pay at clinic)  Prior Auth: N/A PA# Expiration Date:   # of doses approved:   If patient wants fill through the pharmacy benefit please send prescription to: [facility], and include estimated need by date in rx notes. Pharmacy will ship medication directly to the office.  Patient NOT eligible for Prolia  Copay Card. Copay Card can make patient's cost as little as $25. Link to apply: [email or web address]://[email or web address]  ** This summary of benefits is an estimation of the patient's out-of-pocket cost. Exact cost may very based on individual plan coverage.

## 2024-02-11 NOTE — Telephone Encounter (Signed)
 Prolia  VOB initiated via [email or web address]  Next Prolia  inj DUE: 03/05/24

## 2024-02-15 ENCOUNTER — Telehealth: Payer: Self-pay

## 2024-02-15 NOTE — Telephone Encounter (Signed)
 Please advise ?  Copied from CRM [id number]. Topic: Appointments - Scheduling Inquiry for Clinic >> Feb 15, 2024 10:23 AM Zy'onna H wrote: Reason for CRM: Patients original Prolia  Injection visit was cancelled on 03/04/2024. The patient is concerned as she has a strict regiment and schedule for these injections, she's worries to miss an injection due to possible adverse side effects.   It appears the last check in was, we were waiting on the insurance company to update/authorize the injection for the pt.   **PCP/PCP Team Please Advise**

## 2024-02-16 NOTE — Telephone Encounter (Signed)
 Informed the patient and she voiced understanding. Patient stated as long as she doesn't get off track her appointment needs to be on 03/07/24.

## 2024-02-17 NOTE — Telephone Encounter (Signed)
 Patient has been sch for 03/07/24 at 10 AM.

## 2024-02-17 NOTE — Telephone Encounter (Signed)
 Patient is ready for scheduling for Prolia  on or after 03/05/2024. Please contact patient for scheduling and advise patient of $352 copay. Please respond with appointment date and time when scheduled.

## 2024-02-18 ENCOUNTER — Other Ambulatory Visit (HOSPITAL_COMMUNITY): Payer: Self-pay

## 2024-02-18 MED ORDER — CEPHALEXIN 250 MG PO CAPS
250.0000 mg | ORAL_CAPSULE | Freq: Every day | ORAL | 3 refills | Status: AC
  Filled 2024-02-18: qty 90, 90d supply, fill #0

## 2024-03-03 ENCOUNTER — Other Ambulatory Visit (HOSPITAL_COMMUNITY): Payer: Self-pay

## 2024-03-03 MED ORDER — PREDNISOLONE ACETATE 1 % OP SUSP
1.0000 [drp] | Freq: Four times a day (QID) | OPHTHALMIC | 1 refills | Status: AC
  Filled 2024-03-03: qty 5, 25d supply, fill #0

## 2024-03-03 MED ORDER — KETOROLAC TROMETHAMINE 0.5 % OP SOLN
1.0000 [drp] | Freq: Two times a day (BID) | OPHTHALMIC | 1 refills | Status: AC
  Filled 2024-03-03: qty 5, 50d supply, fill #0

## 2024-03-03 MED ORDER — GATIFLOXACIN 0.5 % OP SOLN
1.0000 [drp] | Freq: Four times a day (QID) | OPHTHALMIC | 1 refills | Status: AC
  Filled 2024-03-03: qty 5, 25d supply, fill #0

## 2024-03-04 ENCOUNTER — Ambulatory Visit

## 2024-03-04 ENCOUNTER — Encounter (HOSPITAL_COMMUNITY): Payer: Self-pay

## 2024-03-04 ENCOUNTER — Other Ambulatory Visit: Payer: Self-pay

## 2024-03-07 ENCOUNTER — Ambulatory Visit

## 2024-03-07 ENCOUNTER — Other Ambulatory Visit (HOSPITAL_COMMUNITY): Payer: Self-pay

## 2024-03-07 ENCOUNTER — Encounter (HOSPITAL_COMMUNITY): Payer: Self-pay

## 2024-03-08 ENCOUNTER — Ambulatory Visit

## 2024-03-08 ENCOUNTER — Other Ambulatory Visit (HOSPITAL_COMMUNITY): Payer: Self-pay

## 2024-03-08 DIAGNOSIS — M816 Localized osteoporosis [Lequesne]: Secondary | ICD-10-CM

## 2024-03-08 MED ORDER — DENOSUMAB 60 MG/ML ~~LOC~~ SOSY: 60.0000 mg | PREFILLED_SYRINGE | SUBCUTANEOUS | Status: AC

## 2024-03-08 NOTE — Progress Notes (Signed)
 Karen Mcclain is in office today for a nurse visit for Prolia  Injection per Jordan, Betty G, MD order. Patient Injection was given in the  Left arm. Patient tolerated injection well.

## 2024-05-24 ENCOUNTER — Ambulatory Visit: Admitting: Neurology

## 2024-07-19 ENCOUNTER — Ambulatory Visit: Admitting: Family Medicine
